# Patient Record
Sex: Male | Born: 1975 | Smoking: Current every day smoker
Health system: Southern US, Community
[De-identification: ages and names within clinical notes are randomized; demographics above are authoritative.]

## PROBLEM LIST (undated history)

## (undated) DIAGNOSIS — F1020 Alcohol dependence, uncomplicated: Secondary | ICD-10-CM

## (undated) DIAGNOSIS — IMO0001 Reserved for inherently not codable concepts without codable children: Secondary | ICD-10-CM

## (undated) DIAGNOSIS — F419 Anxiety disorder, unspecified: Secondary | ICD-10-CM

## (undated) DIAGNOSIS — G8929 Other chronic pain: Secondary | ICD-10-CM

## (undated) DIAGNOSIS — T1491XA Suicide attempt, initial encounter: Secondary | ICD-10-CM

## (undated) DIAGNOSIS — F1110 Opioid abuse, uncomplicated: Secondary | ICD-10-CM

## (undated) DIAGNOSIS — F319 Bipolar disorder, unspecified: Secondary | ICD-10-CM

## (undated) DIAGNOSIS — M549 Dorsalgia, unspecified: Secondary | ICD-10-CM

## (undated) HISTORY — PX: HERNIA REPAIR: SHX51

---

## 2013-10-01 ENCOUNTER — Emergency Department (HOSPITAL_COMMUNITY)
Admission: EM | Admit: 2013-10-01 | Discharge: 2013-10-01 | Discharge status: Against Medical Advice | Payer: Self-pay | Attending: Emergency Medicine | Admitting: Emergency Medicine

## 2013-10-01 DIAGNOSIS — Y9389 Activity, other specified: Secondary | ICD-10-CM | POA: Insufficient documentation

## 2013-10-01 DIAGNOSIS — W11XXXA Fall on and from ladder, initial encounter: Secondary | ICD-10-CM | POA: Insufficient documentation

## 2013-10-01 DIAGNOSIS — S4980XA Other specified injuries of shoulder and upper arm, unspecified arm, initial encounter: Secondary | ICD-10-CM | POA: Insufficient documentation

## 2013-10-01 DIAGNOSIS — S46909A Unspecified injury of unspecified muscle, fascia and tendon at shoulder and upper arm level, unspecified arm, initial encounter: Secondary | ICD-10-CM | POA: Insufficient documentation

## 2013-10-01 DIAGNOSIS — R110 Nausea: Secondary | ICD-10-CM | POA: Insufficient documentation

## 2013-10-01 DIAGNOSIS — Y929 Unspecified place or not applicable: Secondary | ICD-10-CM | POA: Insufficient documentation

## 2013-10-01 DIAGNOSIS — IMO0002 Reserved for concepts with insufficient information to code with codable children: Secondary | ICD-10-CM | POA: Insufficient documentation

## 2013-10-01 NOTE — ED Notes (Deleted)
Pt sts that she is having back pain, nausea and leg swelling. Pt has had this in the past and was seen at [facility] and dx with UTI. Pt was given an Rx that ends tomorrow. Pt sts the symptoms are not getting any better.

## 2013-10-16 ENCOUNTER — Encounter (HOSPITAL_COMMUNITY): Payer: Self-pay | Admitting: Emergency Medicine

## 2013-10-16 ENCOUNTER — Emergency Department (HOSPITAL_COMMUNITY)
Admission: EM | Admit: 2013-10-16 | Discharge: 2013-10-16 | Discharge status: Against Medical Advice | Payer: Self-pay | Attending: Emergency Medicine | Admitting: Emergency Medicine

## 2013-10-16 DIAGNOSIS — S139XXA Sprain of joints and ligaments of unspecified parts of neck, initial encounter: Secondary | ICD-10-CM | POA: Insufficient documentation

## 2013-10-16 DIAGNOSIS — Y990 Civilian activity done for income or pay: Secondary | ICD-10-CM | POA: Insufficient documentation

## 2013-10-16 DIAGNOSIS — S46909A Unspecified injury of unspecified muscle, fascia and tendon at shoulder and upper arm level, unspecified arm, initial encounter: Secondary | ICD-10-CM | POA: Insufficient documentation

## 2013-10-16 DIAGNOSIS — S4980XA Other specified injuries of shoulder and upper arm, unspecified arm, initial encounter: Secondary | ICD-10-CM | POA: Insufficient documentation

## 2013-10-16 DIAGNOSIS — F17200 Nicotine dependence, unspecified, uncomplicated: Secondary | ICD-10-CM | POA: Insufficient documentation

## 2013-10-16 DIAGNOSIS — S161XXA Strain of muscle, fascia and tendon at neck level, initial encounter: Secondary | ICD-10-CM

## 2013-10-16 DIAGNOSIS — R209 Unspecified disturbances of skin sensation: Secondary | ICD-10-CM | POA: Insufficient documentation

## 2013-10-16 DIAGNOSIS — Y9289 Other specified places as the place of occurrence of the external cause: Secondary | ICD-10-CM | POA: Insufficient documentation

## 2013-10-16 DIAGNOSIS — W108XXA Fall (on) (from) other stairs and steps, initial encounter: Secondary | ICD-10-CM | POA: Insufficient documentation

## 2013-10-16 DIAGNOSIS — Y9389 Activity, other specified: Secondary | ICD-10-CM | POA: Insufficient documentation

## 2013-10-16 MED ORDER — KETOROLAC TROMETHAMINE 30 MG/ML IJ SOLN: 30.0000 mg | Freq: Once | INTRAMUSCULAR | Status: DC

## 2013-10-16 MED ORDER — CYCLOBENZAPRINE HCL 10 MG PO TABS: 5.0000 mg | ORAL_TABLET | Freq: Once | ORAL | Status: DC

## 2013-10-16 NOTE — ED Notes (Signed)
The pt states he needs to go look for his friend in lobby, I told the pt I will go find his friend but he insisted that he must leave his room to go find his friend. Pt aware that he has an xray ordered but states he must find his friend or he will not have a ride home

## 2013-10-16 NOTE — ED Provider Notes (Signed)
CSN: [id number]     Arrival date & time 10/16/13  1522 History  This chart was scribed for non-physician practitioner, Earley FavorGail Gregory Dowe, FNP working with Shon Batonourtney F Horton, MD by Greggory StallionKayla Andersen, ED scribe. This patient was seen in room TR06C/TR06C and the patient's care was started at 4:07 PM.   Chief Complaint  Patient presents with  . Fall   The history is provided by the patient. No language interpreter was used.   HPI Comments: Alexander [name] is a 38 y.o. male who presents to the Emergency Department complaining of a fall that occurred yesterday at work. Pt fell down 7 steps and caught his arm in the brace of the steps and landed on his neck. He has sudden onset onset right shoulder pain and right sided neck pain. Pt has taken ibuprofen and tylenol with no relief. Turning his neck to the right worsens the pain. Pt is also starting to have mild numbness in his right arm, that started hours after the fall   History reviewed. No pertinent past medical history. Past Surgical History  Procedure Laterality Date  . Hernia repair     History reviewed. No pertinent family history. History  Substance Use Topics  . Smoking status: Current Every Day Smoker    Types: Cigarettes  . Smokeless tobacco: Not on file  . Alcohol Use: No    Review of Systems  Musculoskeletal: Positive for arthralgias and neck pain.  Neurological: Positive for numbness. Negative for dizziness, weakness and headaches.  All other systems reviewed and are negative.  Allergies  Review of patient's allergies indicates no known allergies.  Home Medications   Prior to Admission medications   Not on File   BP 124/78  Pulse 90  Temp(Src) 98.6 F (37 C) (Oral)  Resp 18  Ht 5\' 10"  (1.778 m)  SpO2 97%  Physical Exam  Nursing note and vitals reviewed. Constitutional: He is oriented to person, place, and time. He appears well-developed and well-nourished. No distress.  HENT:  Head: Normocephalic and atraumatic.   Eyes: EOM are normal.  Neck: Neck supple. No tracheal deviation present.  ROM limited to the right due to pain.   Cardiovascular: Normal rate.   Pulmonary/Chest: Effort normal. No respiratory distress.  Musculoskeletal: Normal range of motion.  Right superior trapezius tenderness. Good strength. Good pulses. Normal ROM of right arm and shoulder.   Neurological: He is alert and oriented to person, place, and time.  Skin: Skin is warm and dry.  Psychiatric: He has a normal mood and affect. His behavior is normal.    ED Course  Procedures (including critical care time)  DIAGNOSTIC STUDIES: Oxygen Saturation is 97% on RA, normal by my interpretation.    COORDINATION OF CARE: 4:09 PM-Discussed treatment plan which includes xray with pt at bedside and pt agreed to plan.   Labs Review Labs Reviewed - No data to display  Imaging Review No results found.   EKG Interpretation None      MDM  Pain, a CT scan of neck fracture or subluxation.  Unlikely to patient's symptoms of numbness.  Will review he has been given IM Toradol, as well as Flexeril.  We'll reassess after x-rays After being told that he was to be given a muscle relaxer and CT scan stated he had to go find his ride in the parking lot and never returned  Final diagnoses:  Cervical strain      I personally performed the services described in this documentation, which was  scribed in my presence. The recorded information has been reviewed and is accurate.  Arman FilterGail K Nadine Ryle, NP 10/16/13 1711  Arman FilterGail K Alexande Sheerin, NP 10/16/13 1712

## 2013-10-16 NOTE — ED Notes (Signed)
Pt reports falling at work yesterday and having neck pain, right shoulder pain and now developing numbness to right arm. No acute distress noted at triage, ambulatory.

## 2013-10-16 NOTE — ED Notes (Signed)
The pt has not returned

## 2013-10-16 NOTE — ED Notes (Signed)
The pt still has not returned to his room

## 2013-10-17 NOTE — ED Provider Notes (Signed)
Medical screening examination/treatment/procedure(s) were performed by non-physician practitioner and as supervising physician I was immediately available for consultation/collaboration.   EKG Interpretation None       Somya Jauregui F Morelia Cassells, MD 10/17/13 0100 

## 2013-12-29 ENCOUNTER — Emergency Department (HOSPITAL_COMMUNITY)
Admission: EM | Admit: 2013-12-29 | Discharge: 2013-12-30 | Disposition: A | Discharge status: Other Acute Inpatient Hospital | Payer: No Typology Code available for payment source | Attending: Emergency Medicine | Admitting: Emergency Medicine

## 2013-12-29 ENCOUNTER — Encounter (HOSPITAL_COMMUNITY): Payer: Self-pay | Admitting: Emergency Medicine

## 2013-12-29 DIAGNOSIS — R45851 Suicidal ideations: Secondary | ICD-10-CM

## 2013-12-29 DIAGNOSIS — F1110 Opioid abuse, uncomplicated: Secondary | ICD-10-CM | POA: Insufficient documentation

## 2013-12-29 DIAGNOSIS — G8929 Other chronic pain: Secondary | ICD-10-CM | POA: Insufficient documentation

## 2013-12-29 DIAGNOSIS — F319 Bipolar disorder, unspecified: Secondary | ICD-10-CM | POA: Insufficient documentation

## 2013-12-29 DIAGNOSIS — F332 Major depressive disorder, recurrent severe without psychotic features: Secondary | ICD-10-CM | POA: Diagnosis present

## 2013-12-29 DIAGNOSIS — T1491XA Suicide attempt, initial encounter: Secondary | ICD-10-CM | POA: Diagnosis present

## 2013-12-29 DIAGNOSIS — F17200 Nicotine dependence, unspecified, uncomplicated: Secondary | ICD-10-CM | POA: Insufficient documentation

## 2013-12-29 DIAGNOSIS — F411 Generalized anxiety disorder: Secondary | ICD-10-CM | POA: Insufficient documentation

## 2013-12-29 HISTORY — DX: Dorsalgia, unspecified: M54.9

## 2013-12-29 HISTORY — DX: Bipolar disorder, unspecified: F31.9

## 2013-12-29 HISTORY — DX: Other chronic pain: G89.29

## 2013-12-29 LAB — CBC
HEMATOCRIT: 39.9 % (ref 39.0–52.0)
HEMOGLOBIN: 14.3 g/dL (ref 13.0–17.0)
MCH: 29.8 pg (ref 26.0–34.0)
MCHC: 35.8 g/dL (ref 30.0–36.0)
MCV: 83.1 fL (ref 78.0–100.0)
Platelets: 257 10*3/uL (ref 150–400)
RBC: 4.8 MIL/uL (ref 4.22–5.81)
RDW: 12.5 % (ref 11.5–15.5)
WBC: 6.6 10*3/uL (ref 4.0–10.5)

## 2013-12-29 LAB — COMPREHENSIVE METABOLIC PANEL
ALK PHOS: 161 U/L — AB (ref 39–117)
ALT: 20 U/L (ref 0–53)
ANION GAP: 13 (ref 5–15)
AST: 13 U/L (ref 0–37)
Albumin: 3.5 g/dL (ref 3.5–5.2)
BILIRUBIN TOTAL: 0.4 mg/dL (ref 0.3–1.2)
BUN: 8 mg/dL (ref 6–23)
CHLORIDE: 101 meq/L (ref 96–112)
CO2: 26 meq/L (ref 19–32)
CREATININE: 0.69 mg/dL (ref 0.50–1.35)
Calcium: 9.7 mg/dL (ref 8.4–10.5)
GFR calc Af Amer: >90 mL/min (ref >90)
Glucose, Bld: 98 mg/dL (ref 70–99)
Potassium: 3.7 mEq/L (ref 3.7–5.3)
Sodium: 140 mEq/L (ref 137–147)
Total Protein: 7.1 g/dL (ref 6.0–8.3)

## 2013-12-29 LAB — RAPID URINE DRUG SCREEN, HOSP PERFORMED
Amphetamines: NOT DETECTED
Barbiturates: NOT DETECTED
Benzodiazepines: NOT DETECTED
Cocaine: NOT DETECTED
Opiates: POSITIVE — AB
TETRAHYDROCANNABINOL: NOT DETECTED

## 2013-12-29 LAB — SALICYLATE LEVEL: Salicylate Lvl: <2 mg/dL — ABNORMAL LOW (ref 2.8–20.0)

## 2013-12-29 LAB — ETHANOL

## 2013-12-29 LAB — ACETAMINOPHEN LEVEL: Acetaminophen (Tylenol), Serum: <15 ug/mL (ref 10–30)

## 2013-12-29 NOTE — ED Notes (Signed)
Patient endorses SI, states he took 15 sleeping pills last night.

## 2013-12-29 NOTE — ED Notes (Addendum)
Black shoes, black shorts, red shirt, orange backpack ( socks, shaving razors, sunglasses, black cell phone), underwear. Placed in locker 40

## 2013-12-29 NOTE — Progress Notes (Signed)
  CARE MANAGEMENT ED NOTE 12/29/2013  Patient:  Alexander [name],Alexander [name]   Account Number:  [id number]  Date Initiated:  12/29/2013  Documentation initiated by:  Radford PaxFERRERO,Jillianna Stanek  Subjective/Objective Assessment:   Patient presents to ED with SI stating she took 15 sleeping pills last night     Subjective/Objective Assessment Detail:     Action/Plan:   Action/Plan Detail:   Anticipated DC Date:       Status Recommendation to Physician:   Result of Recommendation:    Other ED Services  Consult Working Plan    DC Planning Services  Other  PCP issues    Choice offered to / List presented to:            Status of service:  Completed, signed off  ED Comments:   ED Comments Detail:  EDCM spoke to patient at bedside regarding pcp issues. Patient confirms without insurance and [place].  Patient reports he may have  insurance starting with his job soon.  [facility] provided patient with list of pcps who accept patients without insurance, list of discounted pharmacies and websites needymeds.org and Good https://figueroa.info/X.com for medication assistance, phone number to inquire about the orange card and DSS for Medicaid, financial resources in the community such as local churches and [employer],  and dental assistance for patients without insurance.  EDCM also provided patient with a pamphlet to [facility] and informed patient that walkins are welcome from 9am -1030am Mon- Thurs.  Patient thnakful for resources.  No further EDCM needs at this time.

## 2013-12-29 NOTE — ED Notes (Signed)
MD at bedside. 

## 2013-12-29 NOTE — ED Provider Notes (Signed)
CSN: [id number]     Arrival date & time 12/29/13  1944 History   First MD Initiated Contact with Patient 12/29/13 2032     Chief Complaint  Patient presents with  . Suicidal    took 15 sleeping pills yesterday     (Consider location/radiation/quality/duration/timing/severity/associated sxs/prior Treatment) HPI 38 year old male with history of bipolar disorder anxiety depression chronic back pain presents with several weeks of depression worsening with suicidal ideation he states he took 2 over-the-counter sleeping pills last night as well as was injected with an unknown drug last night off the streets to try to kill himself; he denies any threats to harm others denies hallucinations denies homicidal ideation but still has suicidal ideation and would like help to get better. He denies other concerns at this time. There is no treatment prior to arrival. Past Medical History  Diagnosis Date  . Chronic back pain   . Bipolar disorder    Past Surgical History  Procedure Laterality Date  . Hernia repair     History reviewed. No pertinent family history. History  Substance Use Topics  . Smoking status: Current Every Day Smoker -- 1.00 packs/day    Types: Cigarettes  . Smokeless tobacco: Never Used  . Alcohol Use: No     Comment: occ    Review of Systems  10 Systems reviewed and are negative for acute change except as noted in the HPI.   Allergies  Review of patient's allergies indicates no known allergies.  Home Medications   Prior to Admission medications   Not on File   BP 120/82  Pulse 74  Temp(Src) 98.2 F (36.8 C) (Oral)  Resp 12  SpO2 99% Physical Exam  Nursing note and vitals reviewed. Constitutional: He is oriented to person, place, and time.  Awake, alert, nontoxic appearance.  HENT:  Head: Atraumatic.  Eyes: Right eye exhibits no discharge. Left eye exhibits no discharge.  Neck: Neck supple.  Cardiovascular: Normal rate and regular rhythm.   No murmur  heard. Pulmonary/Chest: Effort normal and breath sounds normal. No respiratory distress. He has no wheezes. He has no rales. He exhibits no tenderness.  Abdominal: Soft. Bowel sounds are normal. There is no tenderness. There is no rebound.  Musculoskeletal: He exhibits no tenderness.  Baseline ROM, no obvious new focal weakness.  Neurological: He is alert and oriented to person, place, and time.  Mental status and motor strength appears baseline for patient and situation.  Skin: No rash noted.  Psychiatric:  Patient appears anxious and depressed with suicidal ideation without homicidal ideation or hallucinations    ED Course  Procedures (including critical care time) Patient understand and agree with initial ED impression and plan with expectations set for ED visit.TTS pnd. Labs Review Labs Reviewed  COMPREHENSIVE METABOLIC PANEL - Abnormal; Notable for the following:    Alkaline Phosphatase 161 (*)    All other components within normal limits  SALICYLATE LEVEL - Abnormal; Notable for the following:    Salicylate Lvl <2.0 (*)    All other components within normal limits  URINE RAPID DRUG SCREEN (HOSP PERFORMED) - Abnormal; Notable for the following:    Opiates POSITIVE (*)    All other components within normal limits  ACETAMINOPHEN LEVEL  CBC  ETHANOL    Imaging Review No results found.   EKG Interpretation None     Muse not working: ECG: Normal sinus rhythm, ventricular rate 72, normal axis, impression normal ECG, no comparison ECG available MDM   Final diagnoses:  Suicidal ideation    Dispo pnd.    Hurman HornJohn M Cas Tracz, MD 12/30/13 1341

## 2013-12-30 ENCOUNTER — Inpatient Hospital Stay (HOSPITAL_COMMUNITY)
Admission: AD | Admit: 2013-12-30 | Discharge: 2014-01-02 | DRG: 885 | Disposition: A | Discharge status: Home / Self Care | Payer: No Typology Code available for payment source | Source: Intra-hospital | Attending: Psychiatry | Admitting: Psychiatry

## 2013-12-30 ENCOUNTER — Encounter (HOSPITAL_COMMUNITY): Payer: Self-pay | Admitting: *Deleted

## 2013-12-30 ENCOUNTER — Encounter (HOSPITAL_COMMUNITY): Payer: Self-pay | Admitting: Registered Nurse

## 2013-12-30 DIAGNOSIS — F4310 Post-traumatic stress disorder, unspecified: Secondary | ICD-10-CM | POA: Diagnosis present

## 2013-12-30 DIAGNOSIS — F1110 Opioid abuse, uncomplicated: Secondary | ICD-10-CM | POA: Diagnosis present

## 2013-12-30 DIAGNOSIS — F332 Major depressive disorder, recurrent severe without psychotic features: Principal | ICD-10-CM | POA: Diagnosis present

## 2013-12-30 DIAGNOSIS — M549 Dorsalgia, unspecified: Secondary | ICD-10-CM | POA: Diagnosis present

## 2013-12-30 DIAGNOSIS — F411 Generalized anxiety disorder: Secondary | ICD-10-CM | POA: Diagnosis present

## 2013-12-30 DIAGNOSIS — R45851 Suicidal ideations: Secondary | ICD-10-CM

## 2013-12-30 DIAGNOSIS — T1491XA Suicide attempt, initial encounter: Secondary | ICD-10-CM | POA: Diagnosis present

## 2013-12-30 DIAGNOSIS — F17200 Nicotine dependence, unspecified, uncomplicated: Secondary | ICD-10-CM | POA: Diagnosis present

## 2013-12-30 DIAGNOSIS — G8929 Other chronic pain: Secondary | ICD-10-CM | POA: Diagnosis present

## 2013-12-30 DIAGNOSIS — F1910 Other psychoactive substance abuse, uncomplicated: Secondary | ICD-10-CM

## 2013-12-30 DIAGNOSIS — F1994 Other psychoactive substance use, unspecified with psychoactive substance-induced mood disorder: Secondary | ICD-10-CM

## 2013-12-30 DIAGNOSIS — G4700 Insomnia, unspecified: Secondary | ICD-10-CM | POA: Diagnosis present

## 2013-12-30 HISTORY — DX: Anxiety disorder, unspecified: F41.9

## 2013-12-30 MED ORDER — DICYCLOMINE HCL 20 MG PO TABS
20.0000 mg | ORAL_TABLET | Freq: Four times a day (QID) | ORAL | Status: DC | PRN
  Administered 2013-12-31: 20 mg via ORAL
  Filled 2013-12-30: qty 1

## 2013-12-30 MED ORDER — ALUM & MAG HYDROXIDE-SIMETH 200-200-20 MG/5ML PO SUSP: 30.0000 mL | ORAL | Status: DC | PRN

## 2013-12-30 MED ORDER — LORAZEPAM 1 MG PO TABS
1.0000 mg | ORAL_TABLET | Freq: Once | ORAL | Status: AC
  Administered 2013-12-30: 1 mg via ORAL
  Filled 2013-12-30: qty 1

## 2013-12-30 MED ORDER — ONDANSETRON 4 MG PO TBDP
4.0000 mg | ORAL_TABLET | Freq: Four times a day (QID) | ORAL | Status: DC | PRN
  Administered 2013-12-31: 4 mg via ORAL
  Filled 2013-12-30: qty 1

## 2013-12-30 MED ORDER — CLONIDINE HCL 0.1 MG PO TABS: 0.1000 mg | ORAL_TABLET | ORAL | Status: DC

## 2013-12-30 MED ORDER — LOPERAMIDE HCL 2 MG PO CAPS
2.0000 mg | ORAL_CAPSULE | ORAL | Status: DC | PRN
  Filled 2013-12-30: qty 1

## 2013-12-30 MED ORDER — NAPROXEN 500 MG PO TABS
500.0000 mg | ORAL_TABLET | Freq: Two times a day (BID) | ORAL | Status: DC | PRN
  Administered 2013-12-31 – 2014-01-02 (×6): 500 mg via ORAL
  Filled 2013-12-30 (×6): qty 1

## 2013-12-30 MED ORDER — MAGNESIUM HYDROXIDE 400 MG/5ML PO SUSP: 30.0000 mL | Freq: Every day | ORAL | Status: DC | PRN

## 2013-12-30 MED ORDER — METHOCARBAMOL 500 MG PO TABS
500.0000 mg | ORAL_TABLET | Freq: Three times a day (TID) | ORAL | Status: DC | PRN
  Administered 2013-12-31 (×2): 500 mg via ORAL
  Filled 2013-12-30 (×2): qty 1

## 2013-12-30 MED ORDER — CLONIDINE HCL 0.1 MG PO TABS
0.1000 mg | ORAL_TABLET | Freq: Four times a day (QID) | ORAL | Status: DC
  Administered 2013-12-30 – 2013-12-31 (×2): 0.1 mg via ORAL
  Filled 2013-12-30 (×7): qty 1

## 2013-12-30 MED ORDER — ACETAMINOPHEN 325 MG PO TABS: 650.0000 mg | ORAL_TABLET | Freq: Four times a day (QID) | ORAL | Status: DC | PRN

## 2013-12-30 MED ORDER — TRAZODONE HCL 50 MG PO TABS
50.0000 mg | ORAL_TABLET | Freq: Every evening | ORAL | Status: DC | PRN
  Administered 2013-12-30 (×2): 50 mg via ORAL
  Filled 2013-12-30 (×6): qty 1

## 2013-12-30 MED ORDER — HYDROXYZINE HCL 25 MG PO TABS
25.0000 mg | ORAL_TABLET | Freq: Four times a day (QID) | ORAL | Status: DC | PRN
Start: 2013-12-30 — End: 2014-01-02
  Administered 2013-12-31: 25 mg via ORAL
  Filled 2013-12-30: qty 1

## 2013-12-30 MED ORDER — CLONIDINE HCL 0.1 MG PO TABS: 0.1000 mg | ORAL_TABLET | Freq: Every day | ORAL | Status: DC

## 2013-12-30 NOTE — Progress Notes (Signed)
Adult Psychoeducational Group Note  Date:  12/30/2013 Time:  9:33 PM  Group Topic/Focus:  Wrap-Up Group:   The focus of this group is to help patients review their daily goal of treatment and discuss progress on daily workbooks.  Participation Level:  Active  Participation Quality:  Appropriate and Attentive  Affect:  Appropriate  Cognitive:  Appropriate  Insight: Appropriate  Engagement in Group:  Engaged  Modes of Intervention:  Discussion  Additional Comments:  Pt attended the wrap up group this evening and remained appropriate and engaged throughout the duration of the group. Pt ranked his day as a 4 because he had to wait an extended period in another hospital before [facility]. Pt also shared that he doesn't feel very good today.  Sheran Lawlesseese, Kolette Vey O 12/30/2013, 9:33 PM

## 2013-12-30 NOTE — Progress Notes (Signed)
[facility] provided pt with a list of primary care resources and a [facility] Orange Card application, highlighting [facility], to help patient establish primary care.  °

## 2013-12-30 NOTE — BH Assessment (Signed)
[facility] Assessment Progress Note   [facility] to the service of Dr Dub MikesLugo per Phillip HealAva Stevenson and will go to room 301-2.  Support paperwork complete.  ED staff notified.

## 2013-12-30 NOTE — Progress Notes (Signed)
Pt was given "New Beginnings" packet. Pt asked about the packet but did not appear interested in reading it.

## 2013-12-30 NOTE — Consult Note (Signed)
[place] Psychiatry Consult   Reason for Consult:  Suicidal Ideation; overdose Referring Physician:  EDP  Kie Calvin is an 38 y.o. male. Total Time spent with patient: 45 minutes  Assessment: AXIS I:  Major Depression, Recurrent severe, Substance Abuse and Substance Induced Mood Disorder AXIS II:  Deferred AXIS III:   Past Medical History  Diagnosis Date  . Chronic back pain   . Bipolar disorder    AXIS IV:  other psychosocial or environmental problems AXIS V:  11-20 some danger of hurting self or others possible OR occasionally fails to maintain minimal personal hygiene OR gross impairment in communication  Plan:  Recommend psychiatric Inpatient admission when medically cleared.  Subjective:   Shun Pletz is a 38 y.o. male patient admitted with Major Depression, Recurrent severe, Substance Abuse and Substance Induced Mood Disorder.  HPI:  Patient states "I tried suicide attempt; overdosing on sleeping pills; I wanted to die."  Patient states that he goes to [facility] for outpatient services; last visit was a month ago.  Patient states that he stopped taking his medication "because I couldn't afford it."  Patient also has a past history of inpatient hospitalization related to previous suicide attempts cutting wrist and jumping off of a bridge. Patient continues to endorse suicidal ideation with a plan.  Patient denies homicidal ideation, psychosis, and paranoia.  HPI Elements:   Location:  Depression. Quality:  Suicide attempt. Severity:  Overdosing sleeping pills. Timing:  1 day. Review of Systems  Constitutional: Negative for diaphoresis.  Gastrointestinal: Negative for abdominal pain.  Musculoskeletal: Negative.   Neurological: Negative for headaches.  Psychiatric/Behavioral: Positive for depression, suicidal ideas and substance abuse. Negative for hallucinations and memory loss. The patient is not nervous/anxious and does not have insomnia.      History reviewed. No pertinent family history.  Past Psychiatric History: Past Medical History  Diagnosis Date  . Chronic back pain   . Bipolar disorder     reports that he has been smoking Cigarettes.  He has been smoking about 1.00 pack per day. He has never used smokeless tobacco. He reports that he does not drink alcohol or use illicit drugs. History reviewed. No pertinent family history.         Allergies:  No Known Allergies  ACT Assessment Complete:  Yes:    Educational Status    Risk to Self: Risk to self Is patient at risk for suicide?: Yes Substance abuse history and/or treatment for substance abuse?: Yes  Risk to Others:    Abuse:    Prior Inpatient Therapy:    Prior Outpatient Therapy:    Additional Information:      Objective: Blood pressure 120/82, pulse 74, temperature 98.2 F (36.8 C), temperature source Oral, resp. rate 12, SpO2 99.00%.There is no weight on file to calculate BMI. Results for orders placed during the hospital encounter of 12/29/13 (from the past 72 hour(s))  ACETAMINOPHEN LEVEL     Status: None   Collection Time    12/29/13  8:18 PM      Result Value Ref Range   Acetaminophen (Tylenol), Serum <15.0  10 - 30 ug/mL   Comment:            THERAPEUTIC CONCENTRATIONS VARY     SIGNIFICANTLY. A RANGE OF 10-30     ug/mL MAY BE AN EFFECTIVE     CONCENTRATION FOR MANY PATIENTS.     HOWEVER, SOME ARE BEST TREATED     AT CONCENTRATIONS OUTSIDE THIS  RANGE.     ACETAMINOPHEN CONCENTRATIONS     >150 ug/mL AT 4 HOURS AFTER     INGESTION AND >50 ug/mL AT 12     HOURS AFTER INGESTION ARE     OFTEN ASSOCIATED WITH TOXIC     REACTIONS.  CBC     Status: None   Collection Time    12/29/13  8:18 PM      Result Value Ref Range   WBC 6.6  4.0 - 10.5 K/uL   RBC 4.80  4.22 - 5.81 MIL/uL   Hemoglobin 14.3  13.0 - 17.0 g/dL   HCT 39.9  39.0 - 52.0 %   MCV 83.1  78.0 - 100.0 fL   MCH 29.8  26.0 - 34.0 pg   MCHC 35.8  30.0 - 36.0 g/dL   RDW  12.5  11.5 - 15.5 %   Platelets 257  150 - 400 K/uL  COMPREHENSIVE METABOLIC PANEL     Status: Abnormal   Collection Time    12/29/13  8:18 PM      Result Value Ref Range   Sodium 140  137 - 147 mEq/L   Potassium 3.7  3.7 - 5.3 mEq/L   Chloride 101  96 - 112 mEq/L   CO2 26  19 - 32 mEq/L   Glucose, Bld 98  70 - 99 mg/dL   BUN 8  6 - 23 mg/dL   Creatinine, Ser 0.69  0.50 - 1.35 mg/dL   Calcium 9.7  8.4 - 10.5 mg/dL   Total Protein 7.1  6.0 - 8.3 g/dL   Albumin 3.5  3.5 - 5.2 g/dL   AST 13  0 - 37 U/L   ALT 20  0 - 53 U/L   Alkaline Phosphatase 161 (*) 39 - 117 U/L   Total Bilirubin 0.4  0.3 - 1.2 mg/dL   GFR calc non Af Amer >90  >90 mL/min   GFR calc Af Amer >90  >90 mL/min   Comment: (NOTE)     The eGFR has been calculated using the CKD EPI equation.     This calculation has not been validated in all clinical situations.     eGFR's persistently <90 mL/min signify possible Chronic Kidney     Disease.   Anion gap 13  5 - 15  ETHANOL     Status: None   Collection Time    12/29/13  8:18 PM      Result Value Ref Range   Alcohol, Ethyl (B) <11  0 - 11 mg/dL   Comment:            LOWEST DETECTABLE LIMIT FOR     SERUM ALCOHOL IS 11 mg/dL     FOR MEDICAL PURPOSES ONLY  SALICYLATE LEVEL     Status: Abnormal   Collection Time    12/29/13  8:18 PM      Result Value Ref Range   Salicylate Lvl <4.9 (*) 2.8 - 20.0 mg/dL  URINE RAPID DRUG SCREEN (HOSP PERFORMED)     Status: Abnormal   Collection Time    12/29/13  8:29 PM      Result Value Ref Range   Opiates POSITIVE (*) NONE DETECTED   Cocaine NONE DETECTED  NONE DETECTED   Benzodiazepines NONE DETECTED  NONE DETECTED   Amphetamines NONE DETECTED  NONE DETECTED   Tetrahydrocannabinol NONE DETECTED  NONE DETECTED   Barbiturates NONE DETECTED  NONE DETECTED   Comment:  DRUG SCREEN FOR MEDICAL PURPOSES     ONLY.  IF CONFIRMATION IS NEEDED     FOR ANY PURPOSE, NOTIFY LAB     WITHIN 5 DAYS.                LOWEST  DETECTABLE LIMITS     FOR URINE DRUG SCREEN     Drug Class       Cutoff (ng/mL)     Amphetamine      1000     Barbiturate      200     Benzodiazepine   275     Tricyclics       170     Opiates          300     Cocaine          300     THC              50   Labs are reviewed see above values.  Medications reviewed and no changes made.  No current facility-administered medications for this encounter.   No current outpatient prescriptions on file.    Psychiatric Specialty Exam:     Blood pressure 120/82, pulse 74, temperature 98.2 F (36.8 C), temperature source Oral, resp. rate 12, SpO2 99.00%.There is no weight on file to calculate BMI.  General Appearance: Casual  Eye Contact::  Good  Speech:  Clear and Coherent and Normal Rate  Volume:  Normal  Mood:  Depressed and Hopeless  Affect:  Blunt, Congruent, Depressed and Flat  Thought Process:  Circumstantial  Orientation:  Full (Time, Place, and Person)  Thought Content:  Rumination  Suicidal Thoughts:  Yes.  with intent/plan  Homicidal Thoughts:  No  Memory:  Immediate;   Good Recent;   Good Remote;   Good  Judgement:  Poor  Insight:  Lacking and Shallow  Psychomotor Activity:  Decreased  Concentration:  Fair  Recall:  Good  Fund of Knowledge:Good  Language: Good  Akathisia:  No  Handed:  Right  AIMS (if indicated):     Assets:  Communication Skills Desire for Improvement  Sleep:      Musculoskeletal: Strength & Muscle Tone: within normal limits Gait & Station: normal Patient leans: N/A  Treatment Plan Summary: Daily contact with patient to assess and evaluate symptoms and progress in treatment Medication management  Recommend inpatient treatment.  Patient has been accepted to [place] 301/02.  Will monitor for safety and stabilization until transferred.   Earleen Newport, FNP-BC 12/30/2013 3:04 PM

## 2013-12-30 NOTE — Tx Team (Signed)
Initial Interdisciplinary Treatment Plan  PATIENT STRENGTHS: (choose at least two) Average or above average intelligence Capable of independent living Physical Health Work skills  PATIENT STRESSORS: Financial difficulties Marital or family conflict Medication change or noncompliance   PROBLEM LIST: Problem List/Patient Goals Date to be addressed Date deferred Reason deferred Estimated date of resolution  Alt.thought-depression      SI-attempt                                                 DISCHARGE CRITERIA:  Improved stabilization in mood, thinking, and/or behavior Motivation to continue treatment in a less acute level of care  PRELIMINARY DISCHARGE PLAN: Outpatient therapy  PATIENT/FAMIILY INVOLVEMENT: This treatment plan has been presented to and reviewed with the patient, Alexander [name], and/or family member.  The patient and family have been given the opportunity to ask questions and make suggestions.  Alexander [name], Alexander [name] 12/30/2013, 7:22 PM

## 2013-12-30 NOTE — Progress Notes (Signed)
[facility] Bed [facility]-2.

## 2013-12-30 NOTE — Progress Notes (Signed)
Patient is a 38 [facility] after a suicide attempt on 15 sleeping pills, which he doesn't remember the name of. Patient stated that [place] 6 [place] ago in order to take a jon at [employer]. Patient stated that he recently has lost his car and is stressed about transportation to and from work. Patient stated that his family is still in [place], including his 6216 and 40 year old son. Patient stated that he does not have a good relationship with his children. Patient stated he was not having suicidal thoughts at this time, but contracted for safety while here. Patient stated that he knew he "couldn't hurt himself here, but could when he was discharged." Patient is flat, anxious and guarded. Patient cooperative with the admission process. Patient given unit policies and procedures and verbally expressed understanding. Patient oriented to unit and offered food and fluids.

## 2013-12-31 ENCOUNTER — Encounter (HOSPITAL_COMMUNITY): Payer: Self-pay | Admitting: Psychiatry

## 2013-12-31 DIAGNOSIS — F3130 Bipolar disorder, current episode depressed, mild or moderate severity, unspecified: Secondary | ICD-10-CM

## 2013-12-31 DIAGNOSIS — F4310 Post-traumatic stress disorder, unspecified: Secondary | ICD-10-CM | POA: Diagnosis present

## 2013-12-31 DIAGNOSIS — R45851 Suicidal ideations: Secondary | ICD-10-CM

## 2013-12-31 MED ORDER — NICOTINE 21 MG/24HR TD PT24
21.0000 mg | MEDICATED_PATCH | Freq: Every day | TRANSDERMAL | Status: DC
  Administered 2013-12-31 – 2014-01-02 (×3): 21 mg via TRANSDERMAL
  Filled 2013-12-31 (×4): qty 1

## 2013-12-31 MED ORDER — LOPERAMIDE HCL 2 MG PO CAPS
4.0000 mg | ORAL_CAPSULE | ORAL | Status: DC | PRN
  Administered 2013-12-31: 4 mg via ORAL

## 2013-12-31 MED ORDER — ONDANSETRON HCL 4 MG PO TABS: 4.0000 mg | ORAL_TABLET | Freq: Three times a day (TID) | ORAL | Status: DC | PRN

## 2013-12-31 MED ORDER — MIRTAZAPINE 30 MG PO TABS
30.0000 mg | ORAL_TABLET | Freq: Every day | ORAL | Status: DC
  Administered 2013-12-31: 30 mg via ORAL
  Filled 2013-12-31 (×3): qty 1

## 2013-12-31 MED ORDER — ARIPIPRAZOLE 5 MG PO TABS
5.0000 mg | ORAL_TABLET | Freq: Every day | ORAL | Status: DC
  Administered 2013-12-31 – 2014-01-02 (×3): 5 mg via ORAL
  Filled 2013-12-31 (×4): qty 1
  Filled 2013-12-31: qty 14
  Filled 2013-12-31 (×2): qty 1

## 2013-12-31 MED ORDER — FLUOXETINE HCL 20 MG PO CAPS
20.0000 mg | ORAL_CAPSULE | Freq: Every day | ORAL | Status: DC
  Administered 2013-12-31 – 2014-01-02 (×3): 20 mg via ORAL
  Filled 2013-12-31: qty 14
  Filled 2013-12-31 (×6): qty 1

## 2013-12-31 NOTE — Progress Notes (Signed)
Pt woke up around 420 am complaining of withdrawal irritability and sleeplessness    Pt received vistaril robaxin and naproxen   He also received gator aide   Discussed withdrawal symptoms with patient and medications that would be theraputic for each symptom   Pt verbalized understanding and went back to bed  Medications will be evaluated in epic under doc flow sheets at appropriate time

## 2013-12-31 NOTE — Progress Notes (Signed)
Patient ID: Alexander [name], male   DOB: 23-Jul-1975, 38 y.o.   MRN: [id number] D: pt. Visible on unit in dayroom, no interaction noted. Pt reports depression at "6" of 10, currently denies SI. "I was off my medications" A: Writer provided emotional support, reviewed medications encouraged client to report any concerns. Staff will monitor q6415min for safety. R: Pt. Is safe on the unit, attended group.

## 2013-12-31 NOTE — [facility] Suicide Risk Assessment (Signed)
[facility] INPATIENT:  Family/Significant Other Suicide Prevention Education  Suicide Prevention Education:  Patient Refusal for Family/Significant Other Suicide Prevention Education: The patient Alexander [name] has refused to provide written consent for family/significant other to be provided Family/Significant Other Suicide Prevention Education during admission and/or prior to discharge.  Physician notified.  SPE completed with pt. SPI pamphlet provided to pt and he was encouraged to share information with support network, ask questions, and talk about any concerns relating to SPE.  Smart, Danali Marinos LCSWA  12/31/2013, 12:40 PM

## 2013-12-31 NOTE — [facility] Suicide Risk Assessment (Signed)
Suicide Risk Assessment  Admission Assessment     Nursing information obtained from:  Patient Demographic factors:  Male;Caucasian;Living alone;Low socioeconomic status Current Mental Status:  Self-harm thoughts;Self-harm behaviors Loss Factors:  Financial problems / change in socioeconomic status Historical Factors:  Prior suicide attempts;Impulsivity Risk Reduction Factors:  Employed Total Time spent with patient: 45 minutes  CLINICAL FACTORS:   Depression:   Comorbid alcohol abuse/dependence Impulsivity Insomnia Severe  PsychCOGNITIVE FEATURES THAT CONTRIBUTE TO RISK:  Closed-mindedness Polarized thinking Thought constriction (tunnel vision)    SUICIDE RISK:   Moderate:  Frequent suicidal ideation with limited intensity, and duration, some specificity in terms of plans, no associated intent, good self-control, limited dysphoria/symptomatology, some risk factors present, and identifiable protective factors, including available and accessible social support.  PLAN OF CARE: Supportive approach/coping skills/relapse prevention                               Resume Prozac/Abilify  I certify that inpatient services furnished can reasonably be expected to improve the patient's condition.  Hayden Mabin A 12/31/2013, 4:09 PM

## 2013-12-31 NOTE — Consult Note (Signed)
Face to face evaluation and I agree with this note 
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
## 2013-12-31 NOTE — Progress Notes (Signed)
Patient ID: Alexander [name], male   DOB: 01/04/1976, 38 y.o.   MRN: [id number] He has been in bed most of day except to get his medications and meals. He is flat not talking except to answer a question. Self Inventory: depression  And hopeless at 10, withdrawals of agitation, SI of and off contracts for safety, and he denies pain.
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
## 2013-12-31 NOTE — [facility] Group Notes (Signed)
[facility] LCSW Group Therapy  12/31/2013 2:51 PM  Type of Therapy:  Group Therapy  Participation Level:  Did Not Attend-pt in room resting.   Smart, Clatie Kessen LCSWA  12/31/2013, 2:51 PM

## 2013-12-31 NOTE — Clinical Social Work Note (Signed)
Per pt request, [facility] [place] [employer] [employer] ATTN: Will Long excusing pt from work from 7/6 until his discharge. Pt signed ROI consenting to transmission of this information and he was provided with fax and confirmation sheet for his records.   The Sherwin-WilliamsHeather Smart, LCSWA 12/31/2013 11:16 AM

## 2013-12-31 NOTE — [facility] Group Notes (Signed)
[facility] Note   12/31/2013 11:01 AM  Participation Quality:  DID NOT ATTEND-pt in room resting.   Smart, American FinancialHeather LCSWA

## 2014-01-01 DIAGNOSIS — F411 Generalized anxiety disorder: Secondary | ICD-10-CM

## 2014-01-01 MED ORDER — LORAZEPAM 1 MG PO TABS
ORAL_TABLET | ORAL | Status: AC
  Filled 2014-01-01: qty 1

## 2014-01-01 MED ORDER — LORAZEPAM 1 MG PO TABS
1.0000 mg | ORAL_TABLET | Freq: Four times a day (QID) | ORAL | Status: DC | PRN
  Administered 2014-01-01 – 2014-01-02 (×3): 1 mg via ORAL
  Filled 2014-01-01 (×2): qty 1

## 2014-01-01 MED ORDER — MIRTAZAPINE 15 MG PO TABS
45.0000 mg | ORAL_TABLET | Freq: Every day | ORAL | Status: DC
  Administered 2014-01-01: 45 mg via ORAL
  Filled 2014-01-01: qty 42
  Filled 2014-01-01 (×2): qty 1

## 2014-01-01 NOTE — [facility] Group Notes (Signed)
0900 nursing orientation group   The focus of this group is to educate the patient on the purpose and policies of crisis stabilization and provide a format to answer questions about their admission.  The group details unit policies and expectations of patients while admitted.  Pt was an active participant he stated, "I want talk with doctor about my medications"

## 2014-01-01 NOTE — Progress Notes (Signed)
D: Patient denies SI, HI or AVH.  Pt. Complains of chronic back pain and anxiety this morning.  He is flat, anxious and depressed.  He forwards little but is up attending groups and is otherwise appropriate with others within the milieu.  A: Patient given emotional support from RN. Patient encouraged to come to staff with concerns and/or questions. Patient's medication routine continued. Patient's orders and plan of care reviewed.   R: Patient remains appropriate and cooperative. Will continue to monitor patient q15 minutes for safety.

## 2014-01-01 NOTE — Progress Notes (Signed)
Patient did attend the evening karaoke group. Pt was attentive and supportive but did not participate by singing a song.   

## 2014-01-01 NOTE — Progress Notes (Signed)
Recreation Therapy Notes  Animal-Assisted Activity/Therapy (AAA/T) Program Checklist/Progress Notes Patient Eligibility Criteria Checklist & Daily Group note for Rec Tx Intervention  Date: 07.09.2015 Time: 2:45pm Location: [address] Dayroom    AAA/T Program Assumption of Risk Form signed by Patient/ or Parent Legal Guardian yes  Patient is free of allergies or sever asthma yes  Patient reports no fear of animals yes  Patient reports no history of cruelty to animals yes   Patient understands his/her participation is voluntary yes  Patient washes hands before animal contact yes  Patient washes hands after animal contact yes  Behavioral Response: Engaged, Appropriate   Education: Hand Washing, Appropriate Animal Interaction   Education Outcome: Acknowledges understanding   Clinical Observations/Feedback: Patient interacted appropriately with therapy dog and peers in session.   Khoa Opdahl L Alanee Ting, LRT/CTRS  Miria Cappelli L 01/01/2014 4:05 PM 

## 2014-01-01 NOTE — Progress Notes (Signed)
[facility] MD Progress Note  01/01/2014 3:12 PM Alexander [name]  MRN:  [id number] Subjective:  Alexander [name] states he experienced a lot of anxiety, agitation. "Vistaril not helping." Has not heard from work, admits it is making him very anxious, worried. Alexander [name] has been off his medications for a while so understands that it is going to take some time before he sees benefit and gets used to side effects Diagnosis:   DSM5: Schizophrenia Disorders:  none Obsessive-Compulsive Disorders:  none Trauma-Stressor Disorders:  Posttraumatic Stress Disorder (309.81) Substance/Addictive Disorders:  Past HX Depressive Disorders:  Major Depressive Disorder - Severe (296.23) Total Time spent with patient: 30 minutes  Axis I: Generalized Anxiety Disorder R/O Bipolar Depression  ADL's:  Intact  Sleep: Poor  Appetite:  Poor  Suicidal Ideation:  Plan:  denies Intent:  denies Means:  denies Homicidal Ideation:  Plan:  denies Intent:  denies Means:  denies AEB (as evidenced by):  Psychiatric Specialty Exam: Physical Exam  Review of Systems  Constitutional: Positive for malaise/fatigue.  HENT: Negative.   Eyes: Negative.   Respiratory: Negative.   Cardiovascular: Negative.   Gastrointestinal: Negative.   Genitourinary: Negative.   Musculoskeletal: Negative.   Skin: Negative.   Neurological: Positive for weakness.  Endo/Heme/Allergies: Negative.   Psychiatric/Behavioral: Positive for depression. The patient is nervous/anxious and has insomnia.     Blood pressure 130/82, pulse 94, temperature 98 F (36.7 C), temperature source Oral, resp. rate 20, height 5' 8.11" (1.73 m), weight 86.183 kg (190 lb).Body mass index is 28.8 kg/(m^2).  General Appearance: Disheveled  Alexander [name]::  Fair  Speech:  Clear and Coherent  Volume:  Decreased  Mood:  Anxious, Depressed and worried  Affect:  anxious, worried  Thought Process:  Coherent and Goal Directed  Orientation:  Full (Time, Place, and Person)   Thought Content:  worries, concerns, symptoms  Suicidal Thoughts:  Intermittent  Homicidal Thoughts:  No  Memory:  Immediate;   Fair Recent;   Fair Remote;   Fair  Judgement:  Fair  Insight:  Shallow  Psychomotor Activity:  Restlessness  Concentration:  Fair  Recall:  Alexander [name] of Knowledge:NA  Language: Fair  Akathisia:  Yes  Handed:    AIMS (if indicated):     Assets:  Desire for Improvement Vocational/Educational  Sleep:  Number of Hours: 6   Musculoskeletal: Strength & Muscle Tone: within normal limits Gait & Station: normal Patient leans: N/A  Current Medications: Current Facility-Administered Medications  Medication Dose Route Frequency Provider Last Rate Last Dose  . acetaminophen (TYLENOL) tablet 650 mg  650 mg Oral Q6H PRN Shuvon Rankin, NP      . alum & mag hydroxide-simeth (MAALOX/MYLANTA) 200-200-20 MG/5ML suspension 30 mL  30 mL Oral Q4H PRN Shuvon Rankin, NP      . ARIPiprazole (ABILIFY) tablet 5 mg  5 mg Oral Daily Rachael Fee, MD   5 mg at 01/01/14 0750  . dicyclomine (BENTYL) tablet 20 mg  20 mg Oral Q6H PRN Kerry Hough, PA-C   20 mg at 12/31/13 1528  . FLUoxetine (PROZAC) capsule 20 mg  20 mg Oral Daily Rachael Fee, MD   20 mg at 01/01/14 0750  . hydrOXYzine (ATARAX/VISTARIL) tablet 25 mg  25 mg Oral Q6H PRN Kerry Hough, PA-C   25 mg at 12/31/13 0428  . loperamide (IMODIUM) capsule 2-4 mg  2-4 mg Oral PRN Kerry Hough, PA-C      . loperamide (IMODIUM) capsule 4 mg  4  mg Oral PRN Rachael FeeIrving A Marygrace Sandoval, MD   4 mg at 12/31/13 1528  . LORazepam (ATIVAN) tablet 1 mg  1 mg Oral Q6H PRN Rachael FeeIrving A Jamarrion Budai, MD   1 mg at 01/01/14 0941  . magnesium hydroxide (MILK OF MAGNESIA) suspension 30 mL  30 mL Oral Daily PRN Shuvon Rankin, NP      . methocarbamol (ROBAXIN) tablet 500 mg  500 mg Oral Q8H PRN Kerry HoughSpencer E Simon, PA-C   500 mg at 12/31/13 2151  . mirtazapine (REMERON) tablet 45 mg  45 mg Oral QHS Rachael FeeIrving A Neville Walston, MD      . naproxen (NAPROSYN) tablet 500 mg  500 mg  Oral BID PRN Kerry HoughSpencer E Simon, PA-C   500 mg at 01/01/14 0751  . nicotine (NICODERM CQ - dosed in mg/24 hours) patch 21 mg  21 mg Transdermal Q0600 Rachael FeeIrving A Paddy Walthall, MD   21 mg at 01/01/14 0618  . ondansetron (ZOFRAN-ODT) disintegrating tablet 4 mg  4 mg Oral Q6H PRN Kerry HoughSpencer E Simon, PA-C   4 mg at 12/31/13 1529    Lab Results: No results found for this or any previous visit (from the past 48 hour(s)).  Physical Findings: AIMS: Facial and Oral Movements Muscles of Facial Expression: None, normal Lips and Perioral Area: None, normal Jaw: None, normal Tongue: None, normal,Extremity Movements Upper (arms, wrists, hands, fingers): None, normal Lower (legs, knees, ankles, toes): None, normal, Trunk Movements Neck, shoulders, hips: None, normal, Overall Severity Severity of abnormal movements (highest score from questions above): None, normal Incapacitation due to abnormal movements: None, normal Patient's awareness of abnormal movements (rate only patient's report): No Awareness, Dental Status Current problems with teeth and/or dentures?: No Does patient usually wear dentures?: No  CIWA:  CIWA-Ar Total: 1 COWS:  COWS Total Score: 2  Treatment Plan Summary: Daily contact with patient to assess and evaluate symptoms and progress in treatment Medication management  Plan: Supportive approach/coping skills/relapse prevention           Optmize response to psychotropics           R/O possible akathisia secondary to the Abilify           Ativan 1 mg Q 8 H PRN  Medical Decision Making Problem Points:  New problem, with no additional work-up planned (3) and Review of psycho-social stressors (1) Data Points:  Review of medication regiment & side effects (2) Review of new medications or change in dosage (2)  I certify that inpatient services furnished can reasonably be expected to improve the patient's condition.   Shandee Jergens A 01/01/2014, 3:12 PM

## 2014-01-01 NOTE — [facility] Group Notes (Signed)
[facility] LCSW Group Therapy  01/01/2014 3:09 PM  Type of Therapy:  Group Therapy  Participation Level:  Did Not Attend-pt in room resting   Smart, Annie Roseboom LCSWA  01/01/2014, 3:09 PM

## 2014-01-01 NOTE — Progress Notes (Signed)
Patient ID: Alexander MaltaChristopher [name], male   DOB: 04/18/1976, 38 y.o.   MRN: [id number] D: Pt. Visible on milieu, seen in dayroom watching TV, minimal interaction. Pt. Reports depression and anxiety at "2" of 10, says he is ready to get back to work. A: Writer encouraged pt to report any changes in anxiety or depression levels, encouraged karaoke and reviewed medications. R: pt. Is safe on the unit, went to [employer].

## 2014-01-02 DIAGNOSIS — F4310 Post-traumatic stress disorder, unspecified: Secondary | ICD-10-CM

## 2014-01-02 DIAGNOSIS — F332 Major depressive disorder, recurrent severe without psychotic features: Principal | ICD-10-CM

## 2014-01-02 DIAGNOSIS — F1110 Opioid abuse, uncomplicated: Secondary | ICD-10-CM

## 2014-01-02 MED ORDER — ARIPIPRAZOLE 5 MG PO TABS: 5.0000 mg | ORAL_TABLET | Freq: Every day | ORAL | Status: DC

## 2014-01-02 MED ORDER — MIRTAZAPINE 45 MG PO TABS: 45.0000 mg | ORAL_TABLET | Freq: Every day | ORAL | Status: DC

## 2014-01-02 MED ORDER — FLUOXETINE HCL 20 MG PO CAPS: 20.0000 mg | ORAL_CAPSULE | Freq: Every day | ORAL | Status: DC

## 2014-01-02 NOTE — Progress Notes (Signed)
Adult Psychoeducational Group Note  Date:  01/02/2014 Time:  12:28 PM  Group Topic/Focus:  Relapse Prevention Planning:   The focus of this group is to define relapse and discuss the need for planning to combat relapse.  Participation Level:  Minimal  Participation Quality:  Appropriate, Drowsy and Resistant  Affect:  Appropriate and Flat  Cognitive:  Alert and Appropriate  Insight: Appropriate  Engagement in Group:  Limited  Modes of Intervention:  Discussion and Education  Additional Comments:  Pt attended group with minimal participation. Pt appeared flat and was resistant towards joining discussion with group. Pt was participating in group by nodding his head yes when he agreed with any material/topic discussed in group.    Dalia HeadingSeeley, Ava Tangney Aileen 01/02/2014, 12:28 PM

## 2014-01-02 NOTE — Tx Team (Signed)
Interdisciplinary Treatment Plan Update (Adult)  Date: 01/02/2014   Time Reviewed: 10:09 AM  Progress in Treatment:  Attending groups: Intermittently  Participating in groups:  Minimally  Taking medication as prescribed: Yes  Tolerating medication: Yes  Family/Significant othe contact made: No. Pt refused to consent to family contact. SPE completed with pt.  Patient understands diagnosis: Yes, AEB seeking treatment for med management, SI, and mood stabilization.  Discussing patient identified problems/goals with staff: Yes  Medical problems stabilized or resolved: Yes  Denies suicidal/homicidal ideation: Yes during group/self report.  Patient has not harmed self or Others: Yes  New problem(s) identified:  Discharge Plan or Barriers: Pt plans to return home at d/c and [facility] until his insurance coverage kicks in next month. He requested note for work allowing him to return without restrictions for Monday. (note in chart). Bus pass in chart per pt request.  Additional comments: 38 Y/O male who states he has a long history of having psychiatric problems. . He stop taking his medications as could not afford them. 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 As he has not been consistent wit his job due to the same he thinks he might have lost his job. States he has been having a lot of stressful situations happening. His mother went into a coma and had been in and out. In this area since Oct 2014. States he has couple of family members. He came for the job. States he has a past history of substance abuse as of recently he rinks on occasion to "self medicate." States he go so depressed that he asked some addicts to inject him to die. States they injected he thinks heroin in his veins. At the ED he stated he took an OD of sleeping pills. He has history of previous suicidal attempts, overdosing, ingesting mercury, cutting, jumping from a bridge  Reason for  Continuation of Hospitalization: d/c today  Estimated length of stay: x For review of initial/current patient goals, please see plan of care.  Attendees:  Patient:    Family:    Physician: Dr. Jama Flavorsobos MD 01/02/2014 10:08 AM   Nursing: Meryl DareJennifer P. RN 01/02/2014 10:08 AM   Clinical Social Worker Niasha Devins Smart, LCSWA  01/02/2014 10:08 AM   Other: Sue LushAndrea RN 01/02/2014 10:08 AM   Other: Chandra BatchAggie N. PA 01/02/2014 10:09 AM   Other: Darden DatesJennifer C. Nurse CM  01/02/2014 10:08 AM   Other:    Scribe for Treatment Team:  Herbert SetaHeather Smart LCSWA 01/02/2014 10:08 AM

## 2014-01-02 NOTE — Progress Notes (Signed)
[facility] Discharge [facility] :  Will you be returning to the same living situation after discharge: Yes,  home At discharge, do you have transportation home?:Yes,  bus (pass in chart) Do you have the ability to pay for your medications:Yes,  mental health  Release of information consent forms completed and CSW submitted to Medical Records.  Patient to Follow up at: Follow-up Information   Follow up with [facility]. (Walk in between 8am-9am Monday through Friday for hospital follow-up/medication management/assessment for therapy services. )    Contact information:   [address] [place], [place]  Phone: [phone] Fax: [phone]      Patient denies SI/HI:   Yes,  during group/self report.     Safety Planning and Suicide Prevention discussed:  Yes,  SPE completed with pt, as he refused to consent to family contact. SPI pamphlet provided to pt and he was encouraged to share information with support network, ask questions, and talk about any concerns relating to [employer]>  Smart, Irene Mitcham LCSWA  01/02/2014, 10:14 AM

## 2014-01-02 NOTE — Discharge Summary (Signed)
Physician Discharge Summary Note  Patient:  Alexander [name] is an 38 y.o., male MRN:  [id number] DOB:  1976/02/06 Patient phone:  [phone] (home)  Patient address:   [address]  [place] [address],  Total Time spent with patient: Greater than 30 minute  Date of Admission:  12/30/2013 Date of Discharge: 01/02/14  Reason for Admission:  Mood stable  Discharge Diagnoses: Active Problems:   MDD (major depressive disorder), recurrent severe, without psychosis   Post traumatic stress disorder (PTSD)   Psychiatric Specialty Exam: Physical Exam  Psychiatric: His speech is normal and behavior is normal. Judgment and thought content normal. His mood appears not anxious. His affect is not angry, not blunt, not labile and not inappropriate. Cognition and memory are normal. He does not exhibit a depressed mood.    Review of Systems  Constitutional: Negative.   HENT: Negative.   Eyes: Negative.   Respiratory: Negative.   Cardiovascular: Negative.   Gastrointestinal: Negative.   Genitourinary: Negative.   Musculoskeletal: Negative.   Skin: Negative.   Neurological: Negative.   Endo/Heme/Allergies: Negative.   Psychiatric/Behavioral: Positive for depression (Stable) and substance abuse (Hx of). Negative for suicidal ideas, hallucinations and memory loss. The patient has insomnia (Stable). The patient is not nervous/anxious.     Blood pressure 130/94, pulse 103, temperature 98.5 F (36.9 C), temperature source Oral, resp. rate 24, height 5' 8.11" (1.73 m), weight 86.183 kg (190 lb).Body mass index is 28.8 kg/(m^2).   General Appearance: Well Groomed  Patent attorney:: Good  Speech: Normal Rate  Volume: Normal  Mood: improved mood , states he feels less depressed  Affect: Appropriate and reactive  Thought Process: Linear  Orientation: Full (Time, Place, and Person)  Thought Content: no psychotic symptoms- no hallucinations and no delusions  Suicidal Thoughts: No- at this  time denies any thoughts of hurting himself or anyone else  Homicidal Thoughts: No  Memory: NA  Judgement: Fair  Insight: Fair  Psychomotor Activity: Normal  Concentration: Good  Recall: Good  Fund of Knowledge:Good  Language: Good  Akathisia: No  Handed: Right  AIMS (if indicated): Assets: Communication Skills  Desire for Improvement  Social Support  Vocational/Educational  Sleep: Number of Hours: 6.5   Past Psychiatric History: Diagnosis: MDD (major depressive disorder), recurrent severe, without psychosis, Opioid abuse, PTSD  Hospitalizations: [facility] adult  Outpatient Care: [facility]  Substance Abuse Care: NA  Self-Mutilation: NA  Suicidal Attempts: NA  Violent Behaviors: NA   Musculoskeletal: Strength & Muscle Tone: within normal limits Gait & Station: normal Patient leans: N/A  DSM5: Schizophrenia Disorders:  NA Obsessive-Compulsive Disorders:  NA Trauma-Stressor Disorders:  NA Substance/Addictive Disorders:  Opioid Disorder - Moderate (304.00) Depressive Disorders:  MDD (major depressive disorder), recurrent severe, without psychosis  Axis Diagnosis:  AXIS I:  Opioid use disorder, MDD (major depressive disorder), recurrent severe, without psychosis, PTSD AXIS II:  Deferred AXIS III:   Past Medical History  Diagnosis Date  . Chronic back pain   . Bipolar disorder   . Anxiety    AXIS IV:  other psychosocial or environmental problems and Mental illness, chronic AXIS V:  62  Level of Care:  OP  Hospital Course: 38 Y/O male who states he has a long history of having psychiatric problems. . He stop taking his medications as could not afford them. 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 As he has not been consistent wit his job due to the same  he thinks he might have lost his job. States he has been having a lot of stressful situations happening. His mother went into a coma and had been in and out.  Alexander [name] was admitted to  the hospital with his UDS test reports positive for opiates. He also was in need of mood stabilization as he had stopped taking his mental health medications because he could not afford. He needed opioid detoxification treatment as well as resuming medications for mood stabilization. He received Clonidine detox protocols. He was also medicated and discharged on Prozac 20 mg daily for depression, Abilify 5 mg for mood control, Hydroxyzine 25 mg three times daily as needed for anxiety and Mirtazapine 45 mg Q bedtime for depression/sleep. He also enrolled in the group counseling sessions and AA/NA meetings being offered on this unit. He learned coping skills. He tolerated his treatment regimen without any adverse effects and or reactions.  Alexander [name] has completed detox treatment and his mood is stable. This evidenced by his reports of improved mood and absence of withdrawal symptoms. He is currently being discharged to continue psychiatric treatment at the [facility] [place] in [place], [place]. He is provided with all the necessary information needed to make this appointment without problems.  Upon discharge, he adamantly denies any SIHI, AVH, delusional thoughts, paranoia and or withdrawal symptoms. [facility], a 14 days worth, supply samples of [facility]. [facility] with all belongings in no distress. Transportation per city bus.  Consults:  psychiatry  Significant Diagnostic Studies:  labs: CBC with diff, CMP, UDS, toxicology tests, U/A  Discharge Vitals:   Blood pressure 130/94, pulse 103, temperature 98.5 F (36.9 C), temperature source Oral, resp. rate 24, height 5' 8.11" (1.73 m), weight 86.183 kg (190 lb). Body mass index is 28.8 kg/(m^2). Lab Results:   No results found for this or any previous visit (from the past 72 hour(s)).  Physical Findings: AIMS: Facial and Oral Movements Muscles of Facial Expression: None, normal Lips and Perioral Area:  None, normal Jaw: None, normal Tongue: None, normal,Extremity Movements Upper (arms, wrists, hands, fingers): None, normal Lower (legs, knees, ankles, toes): None, normal, Trunk Movements Neck, shoulders, hips: None, normal, Overall Severity Severity of abnormal movements (highest score from questions above): None, normal Incapacitation due to abnormal movements: None, normal Patient's awareness of abnormal movements (rate only patient's report): No Awareness, Dental Status Current problems with teeth and/or dentures?: No Does patient usually wear dentures?: No  CIWA:  CIWA-Ar Total: 1 COWS:  COWS Total Score: 1  Psychiatric Specialty Exam: See Psychiatric Specialty Exam and Suicide Risk Assessment completed by Attending Physician prior to discharge.  Discharge destination:  Home  Is patient on multiple antipsychotic therapies at discharge:  No   Has Patient had three or more failed trials of antipsychotic monotherapy by history:  No  Recommended Plan for Multiple Antipsychotic Therapies: NA    Medication List       Indication   ARIPiprazole 5 MG tablet  Commonly known as:  ABILIFY  Take 1 tablet (5 mg total) by mouth daily. For mood control   Indication:  Mood control     FLUoxetine 20 MG capsule  Commonly known as:  PROZAC  Take 1 capsule (20 mg total) by mouth daily. For depression   Indication:  Depression     mirtazapine 45 MG tablet  Commonly known as:  REMERON  Take 1 tablet (45 mg total) by mouth at bedtime. For depression/sleep  Indication:  Trouble Sleeping, Major Depressive Disorder           Follow-up Information   Follow up with [facility]. (Walk in between 8am-9am Monday through Friday for hospital follow-up/medication management/assessment for therapy services. )    Contact information:   [address] [place], [place]  Phone: [phone] Fax: [phone]     Follow-up recommendations: Activity:  As tolerated Diet: As recommended by your  primary care doctor. Keep all scheduled follow-up appointments as recommended.  Comments:  Take all your medications as prescribed by your mental healthcare provider. Report any adverse effects and or reactions from your medicines to your outpatient provider promptly. Patient is instructed and cautioned to not engage in alcohol and or illegal drug use while on prescription medicines. In the event of worsening symptoms, patient is instructed to call the crisis hotline, 911 and or go to the nearest ED for appropriate evaluation and treatment of symptoms. Follow-up with your primary care provider for your other medical issues, concerns and or health care needs.   Total Discharge Time:  Greater than 30 minutes.  Signed: Armandina Stammer I, PMHNP-BC 01/02/2014, 10:14 AM  I personally assessed the patient and formulated the plan Madie Reno A. Dub Mikes, M.D.

## 2014-01-02 NOTE — [facility] Suicide Risk Assessment (Signed)
   Demographic Factors:  38 year old man, single, lives alone at this time, recently from [place], employed  Total Time spent with patient: 30 minutes  Psychiatric Specialty Exam: Physical Exam  ROS  Blood pressure 130/94, pulse 103, temperature 98.5 F (36.9 C), temperature source Oral, resp. rate 24, height 5' 8.11" (1.73 m), weight 86.183 kg (190 lb).Body mass index is 28.8 kg/(m^2).  General Appearance: Well Groomed  Patent attorneyye Contact::  Good  Speech:  Normal Rate  Volume:  Normal  Mood:  improved mood , states he feels less depressed  Affect:  Appropriate and reactive   Thought Process:  Linear  Orientation:  Full (Time, Place, and Person)  Thought Content:  no psychotic symptoms- no hallucinations and no delusions  Suicidal Thoughts:  No- at this time denies any thoughts of hurting himself or anyone else  Homicidal Thoughts:  No  Memory:  NA  Judgement:  Fair  Insight:  Fair  Psychomotor Activity:  Normal  Concentration:  Good  Recall:  Good  Fund of Knowledge:Good  Language: Good  Akathisia:  No  Handed:  Right  AIMS (if indicated):     Assets:  Communication Skills Desire for Improvement Social Support Vocational/Educational  Sleep:  Number of Hours: 6.5    Musculoskeletal: Strength & Muscle Tone: within normal limits Gait & Station: normal Patient leans: N/A   Mental Status Per Nursing Assessment::   On Admission:  Self-harm thoughts;Self-harm behaviors  Current Mental Status by Physician: At this time patient is feeling better, less depressed, with an improved affect, not suicidal or homicidal , not psychotic, future oriented  Loss Factors: Transportation issues difficulting going to work/ sustain employment . Family in [place]  Historical Factors: Prior history of suicide attempts. History of substance abuse   Risk Reduction Factors:   Responsible for children under 40 years of age, Sense of responsibility to family, Employed and Positive  social support  Continued Clinical Symptoms:  Bipolar Disorder:   Depressive phase/ Opiate Abuse.  Cognitive Features That Contribute To Risk:  No gross cognitive deficits noted upon discharge  Suicide Risk:  Mild:  Suicidal ideation of limited frequency, intensity, duration, and specificity.  There are no identifiable plans, no associated intent, mild dysphoria and related symptoms, good self-control (both objective and subjective assessment), few other risk factors, and identifiable protective factors, including available and accessible social support.  Discharge Diagnoses:   AXIS I:  Bipolar, Depressed and Substance Abuse AXIS II:  Deferred AXIS III:   Past Medical History  Diagnosis Date  . Chronic back pain   . Bipolar disorder   . Anxiety    AXIS IV:  transportation issues and family out of state AXIS V:  51-60 moderate symptoms ( 60 upon discharge)   Plan Of Care/Follow-up recommendations:  Activity:  As tolerated  Diet:  Regular Tests:  NA Other:  See below  Is patient on multiple antipsychotic therapies at discharge:  No   Has Patient had three or more failed trials of antipsychotic monotherapy by history:  No  Recommended Plan for Multiple Antipsychotic Therapies: NA  Patient is leaving clinic in good spirits. Plans to return home, [facility] [employer] Plans to [facility] Plans to continue going to [employer]. Encouraged to get a sponsor.    COBOS, FERNANDO 01/02/2014, 1:18 PM

## 2014-01-02 NOTE — Progress Notes (Signed)
Patient ID: Alexander MaltaChristopher Putz, male   DOB: 1975/07/07, 38 y.o.   MRN: [id number]   D: Patient reports much improvement since back on meds. Mood improved and denies any SI at present.Feels ready for discharge. A: Obtained all belongings, medications, prescriptions, bus pass, and is to [facility]. R: Let out to lobby and explained how to get to bus stiop.

## 2014-01-02 NOTE — BHH Group Notes (Signed)
[facility] LCSW Aftercare Discharge Planning Group Note   01/02/2014 9:45 AM  Participation Quality:  Appropriate   Mood/Affect:  Appropriate  Depression Rating:  3  Anxiety Rating:  3  Thoughts of Suicide:  No Will you contract for safety?   NA  Current AVH:  No  Plan for Discharge/Comments:  Pt reports that he is ready for d/c today. He [facility] until his insurance kicks in next month. Pt requesting return to work letter-in chart. Pt in need of bus pass (in chart) and is reporting no withdrawal symptoms.   Transportation Means: bus-pass in chart   Supports: some family/friend supports.   Smart, American FinancialHeather LCSWA

## 2014-01-07 NOTE — Progress Notes (Signed)
Patient Discharge Instructions:  After Visit Summary (AVS):   Faxed to:  01/07/14 Discharge Summary Note:   Faxed to:  01/07/14 Psychiatric Admission Assessment Note:   Faxed to:  01/07/14 Suicide Risk Assessment - Discharge Assessment:   Faxed to:  01/07/14 Faxed/Sent to the Next Level Care provider:  01/07/14 [facility]ax[facility] to [facility] @ [phone]  Jerelene ReddenSheena E Blue Ball, 01/07/2014, 3:38 PM

## 2014-01-19 ENCOUNTER — Emergency Department (HOSPITAL_COMMUNITY)
Admission: EM | Admit: 2014-01-19 | Discharge: 2014-01-20 | Disposition: A | Discharge status: Other Acute Inpatient Hospital | Payer: Self-pay | Attending: Emergency Medicine | Admitting: Emergency Medicine

## 2014-01-19 ENCOUNTER — Encounter (HOSPITAL_COMMUNITY): Payer: Self-pay | Admitting: Emergency Medicine

## 2014-01-19 DIAGNOSIS — Z79899 Other long term (current) drug therapy: Secondary | ICD-10-CM | POA: Insufficient documentation

## 2014-01-19 DIAGNOSIS — T398X2A Poisoning by other nonopioid analgesics and antipyretics, not elsewhere classified, intentional self-harm, initial encounter: Secondary | ICD-10-CM

## 2014-01-19 DIAGNOSIS — F17200 Nicotine dependence, unspecified, uncomplicated: Secondary | ICD-10-CM | POA: Insufficient documentation

## 2014-01-19 DIAGNOSIS — T43502A Poisoning by unspecified antipsychotics and neuroleptics, intentional self-harm, initial encounter: Secondary | ICD-10-CM | POA: Insufficient documentation

## 2014-01-19 DIAGNOSIS — T43501A Poisoning by unspecified antipsychotics and neuroleptics, accidental (unintentional), initial encounter: Secondary | ICD-10-CM | POA: Insufficient documentation

## 2014-01-19 DIAGNOSIS — F411 Generalized anxiety disorder: Secondary | ICD-10-CM | POA: Insufficient documentation

## 2014-01-19 DIAGNOSIS — T438X2A Poisoning by other psychotropic drugs, intentional self-harm, initial encounter: Secondary | ICD-10-CM | POA: Insufficient documentation

## 2014-01-19 DIAGNOSIS — T394X2A Poisoning by antirheumatics, not elsewhere classified, intentional self-harm, initial encounter: Secondary | ICD-10-CM | POA: Insufficient documentation

## 2014-01-19 DIAGNOSIS — R45851 Suicidal ideations: Secondary | ICD-10-CM | POA: Insufficient documentation

## 2014-01-19 DIAGNOSIS — G8929 Other chronic pain: Secondary | ICD-10-CM | POA: Insufficient documentation

## 2014-01-19 DIAGNOSIS — T50902A Poisoning by unspecified drugs, medicaments and biological substances, intentional self-harm, initial encounter: Secondary | ICD-10-CM

## 2014-01-19 DIAGNOSIS — R443 Hallucinations, unspecified: Secondary | ICD-10-CM | POA: Insufficient documentation

## 2014-01-19 DIAGNOSIS — F1110 Opioid abuse, uncomplicated: Secondary | ICD-10-CM | POA: Insufficient documentation

## 2014-01-19 DIAGNOSIS — F319 Bipolar disorder, unspecified: Secondary | ICD-10-CM | POA: Insufficient documentation

## 2014-01-19 DIAGNOSIS — T40601A Poisoning by unspecified narcotics, accidental (unintentional), initial encounter: Secondary | ICD-10-CM | POA: Insufficient documentation

## 2014-01-19 LAB — COMPREHENSIVE METABOLIC PANEL
ALT: 13 U/L (ref 0–53)
AST: 11 U/L (ref 0–37)
Albumin: 3.4 g/dL — ABNORMAL LOW (ref 3.5–5.2)
Alkaline Phosphatase: 138 U/L — ABNORMAL HIGH (ref 39–117)
Anion gap: 15 (ref 5–15)
BUN: 8 mg/dL (ref 6–23)
CO2: 22 mEq/L (ref 19–32)
CREATININE: 1.23 mg/dL (ref 0.50–1.35)
Calcium: 9.8 mg/dL (ref 8.4–10.5)
Chloride: 106 mEq/L (ref 96–112)
GFR calc non Af Amer: 72 mL/min — ABNORMAL LOW (ref >90)
GFR, EST AFRICAN AMERICAN: 83 mL/min — AB (ref >90)
Glucose, Bld: 110 mg/dL — ABNORMAL HIGH (ref 70–99)
Potassium: 3.2 mEq/L — ABNORMAL LOW (ref 3.7–5.3)
SODIUM: 143 meq/L (ref 137–147)
TOTAL PROTEIN: 6.9 g/dL (ref 6.0–8.3)
Total Bilirubin: 0.3 mg/dL (ref 0.3–1.2)

## 2014-01-19 LAB — CBC WITH DIFFERENTIAL/PLATELET
BASOS ABS: 0 10*3/uL (ref 0.0–0.1)
Basophils Relative: 0 % (ref 0–1)
EOS PCT: 2 % (ref 0–5)
Eosinophils Absolute: 0.1 10*3/uL (ref 0.0–0.7)
HCT: 38.1 % — ABNORMAL LOW (ref 39.0–52.0)
Hemoglobin: 13.2 g/dL (ref 13.0–17.0)
Lymphocytes Relative: 31 % (ref 12–46)
Lymphs Abs: 2.1 10*3/uL (ref 0.7–4.0)
MCH: 29.4 pg (ref 26.0–34.0)
MCHC: 34.6 g/dL (ref 30.0–36.0)
MCV: 84.9 fL (ref 78.0–100.0)
MONO ABS: 0.7 10*3/uL (ref 0.1–1.0)
Monocytes Relative: 10 % (ref 3–12)
Neutro Abs: 3.9 10*3/uL (ref 1.7–7.7)
Neutrophils Relative %: 57 % (ref 43–77)
PLATELETS: 233 10*3/uL (ref 150–400)
RBC: 4.49 MIL/uL (ref 4.22–5.81)
RDW: 13.5 % (ref 11.5–15.5)
WBC: 6.9 10*3/uL (ref 4.0–10.5)

## 2014-01-19 LAB — ETHANOL

## 2014-01-19 LAB — SALICYLATE LEVEL

## 2014-01-19 LAB — RAPID URINE DRUG SCREEN, HOSP PERFORMED
Amphetamines: NOT DETECTED
BARBITURATES: NOT DETECTED
BENZODIAZEPINES: NOT DETECTED
Cocaine: NOT DETECTED
Opiates: POSITIVE — AB
Tetrahydrocannabinol: NOT DETECTED

## 2014-01-19 LAB — ACETAMINOPHEN LEVEL

## 2014-01-19 MED ORDER — MIRTAZAPINE 30 MG PO TABS
45.0000 mg | ORAL_TABLET | Freq: Every day | ORAL | Status: DC
  Administered 2014-01-19: 45 mg via ORAL
  Filled 2014-01-19: qty 2

## 2014-01-19 MED ORDER — POTASSIUM CHLORIDE CRYS ER 20 MEQ PO TBCR
40.0000 meq | EXTENDED_RELEASE_TABLET | Freq: Once | ORAL | Status: AC
  Administered 2014-01-19: 40 meq via ORAL
  Filled 2014-01-19: qty 2

## 2014-01-19 MED ORDER — FLUOXETINE HCL 20 MG PO CAPS
20.0000 mg | ORAL_CAPSULE | Freq: Every day | ORAL | Status: DC
  Administered 2014-01-19: 20 mg via ORAL
  Filled 2014-01-19: qty 1

## 2014-01-19 MED ORDER — ARIPIPRAZOLE 5 MG PO TABS
5.0000 mg | ORAL_TABLET | Freq: Every day | ORAL | Status: DC
  Administered 2014-01-19: 5 mg via ORAL
  Filled 2014-01-19: qty 1

## 2014-01-19 NOTE — ED Provider Notes (Signed)
CSN: [id number]     Arrival date & time 01/19/14  1756 History   First MD Initiated Contact with Patient 01/19/14 1920     Chief Complaint  Patient presents with  . hallucatinations, depression      (Consider location/radiation/quality/duration/timing/severity/associated sxs/prior Treatment) Patient is a 38 y.o. male presenting with mental health disorder. The history is provided by the patient.  Mental Health Problem Presenting symptoms: suicidal thoughts   Degree of incapacity (severity):  Moderate Onset quality:  Gradual Duration:  2 days Timing:  Constant Progression:  Unchanged Chronicity:  Recurrent Context: alcohol use (6 beers yesterday) and drug abuse (morphine pill yesterday)   Treatment compliance:  Most of the time Time since last dose of psychoactive medication: 2 days. Relieved by:  Nothing Worsened by:  Nothing tried Ineffective treatments:  None tried Associated symptoms: no abdominal pain, no chest pain and no headaches     Past Medical History  Diagnosis Date  . Chronic back pain   . Bipolar disorder   . Anxiety    Past Surgical History  Procedure Laterality Date  . Hernia repair     History reviewed. No pertinent family history. History  Substance Use Topics  . Smoking status: Current Every Day Smoker -- 1.00 packs/day    Types: Cigarettes  . Smokeless tobacco: Never Used  . Alcohol Use: No     Comment: occ    Review of Systems  Constitutional: Negative for fever.  HENT: Negative for drooling and rhinorrhea.   Eyes: Negative for pain.  Respiratory: Negative for cough and shortness of breath.   Cardiovascular: Negative for chest pain and leg swelling.  Gastrointestinal: Negative for nausea, vomiting, abdominal pain and diarrhea.  Genitourinary: Negative for dysuria and hematuria.  Musculoskeletal: Negative for gait problem and neck pain.  Skin: Negative for color change.  Neurological: Negative for numbness and headaches.  Hematological:  Negative for adenopathy.  Psychiatric/Behavioral: Positive for suicidal ideas. Negative for behavioral problems.  All other systems reviewed and are negative.     Allergies  Review of patient's allergies indicates no known allergies.  Home Medications   Prior to Admission medications   Medication Sig Start Date End Date Taking? Authorizing Provider  ARIPiprazole (ABILIFY) 5 MG tablet Take 1 tablet (5 mg total) by mouth daily. For mood control 01/02/14   Sanjuana Kava, NP  FLUoxetine (PROZAC) 20 MG capsule Take 1 capsule (20 mg total) by mouth daily. For depression 01/02/14   Sanjuana Kava, NP  mirtazapine (REMERON) 45 MG tablet Take 1 tablet (45 mg total) by mouth at bedtime. For depression/sleep 01/02/14   Sanjuana Kava, NP   BP 80/52  Pulse 93  Temp(Src) 97.5 F (36.4 C) (Oral)  Resp 20  SpO2 97% Physical Exam  Nursing note and vitals reviewed. Constitutional: He is oriented to person, place, and time. He appears well-developed and well-nourished.  HENT:  Head: Normocephalic and atraumatic.  Right Ear: External ear normal.  Left Ear: External ear normal.  Nose: Nose normal.  Mouth/Throat: Oropharynx is clear and moist. No oropharyngeal exudate.  Eyes: Conjunctivae and EOM are normal. Pupils are equal, round, and reactive to light.  Neck: Normal range of motion. Neck supple.  Cardiovascular: Normal rate, regular rhythm, normal heart sounds and intact distal pulses.  Exam reveals no gallop and no friction rub.   No murmur heard. Pulmonary/Chest: Effort normal and breath sounds normal. No respiratory distress. He has no wheezes.  Abdominal: Soft. Bowel sounds are normal. He  exhibits no distension. There is no tenderness. There is no rebound and no guarding.  Musculoskeletal: Normal range of motion. He exhibits no edema and no tenderness.  Neurological: He is alert and oriented to person, place, and time.  Skin: Skin is warm and dry.  Psychiatric: His behavior is normal. He  exhibits a depressed mood.    ED Course  Procedures (including critical care time) Labs Review Labs Reviewed  CBC WITH DIFFERENTIAL - Abnormal; Notable for the following:    HCT 38.1 (*)    All other components within normal limits  COMPREHENSIVE METABOLIC PANEL - Abnormal; Notable for the following:    Potassium 3.2 (*)    Glucose, Bld 110 (*)    Albumin 3.4 (*)    Alkaline Phosphatase 138 (*)    GFR calc non Af Amer 72 (*)    GFR calc Af Amer 83 (*)    All other components within normal limits  SALICYLATE LEVEL - Abnormal; Notable for the following:    Salicylate Lvl <2.0 (*)    All other components within normal limits  ACETAMINOPHEN LEVEL  ETHANOL  URINE RAPID DRUG SCREEN (HOSP PERFORMED)    Imaging Review No results found.   EKG Interpretation   Date/Time:  Monday January 19 2014 20:05:28 EDT Ventricular Rate:  79 PR Interval:  151 QRS Duration: 90 QT Interval:  373 QTC Calculation: 428 R Axis:   87 Text Interpretation:  Sinus rhythm ST elev, probable normal early repol  pattern No significant change since last tracing Confirmed by Fairley Copher   MD, Farhad Burleson ([id number]) on 01/19/2014 8:35:40 PM      MDM   Final diagnoses:  Overdose, intentional self-harm, initial encounter  Suicidal ideations    7:31 PM 38 y.o. male  w a hx of bipolar, anxiety who presents with suicidal ideations. He is depressed due to the fact that he cannot afford his medications. He states that he is been off his psych meds for 2 days. He states that yesterday he took 12 Seroquel, 1 tablet morphine, and drinks 6 beers. He states that he was indirectly trying to hurt himself. He has no specific plan for himself currently but does feel suicidal. Will get screening labs.  Potassium slightly low. Gave oral K-dur to supplement. BP has improved significantly, 111/77 on my exam now. Pt continues to appear well. Has chronic right shoulder pain.   The patient is medically cleared. TTS has evaluated the  patient and [facility] Will transfer.     Junius ArgyleForrest S Nikyla Navedo, MD 01/19/14 2238
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
## 2014-01-19 NOTE — ED Notes (Signed)
Dr Harrison at bedside

## 2014-01-19 NOTE — ED Notes (Signed)
[facility] Expected date:  Expected time:  Means of arrival:  Comments: tr5

## 2014-01-19 NOTE — BH Assessment (Signed)
Assessment completed. Consulted Donell SievertSpencer Simon, [place] who recommended inpatient treatment. [facility] Bed 1. Dr. Romeo AppleHarrison has been notified of recommendation and [facility].

## 2014-01-19 NOTE — BH Assessment (Signed)
Spoke with Dr. Romeo AppleHarrison who reported that pt was [facility] 1 month ago and is depressed due to being unable to get his prescriptions. Pt reported that he drank some beers and took some Seroquel that belonged to his friend on yesterday. Pt is endorsing SI. Assessment will be initiated.

## 2014-01-19 NOTE — ED Notes (Signed)
Patient wanded and belongings searched by security 

## 2014-01-19 NOTE — ED Notes (Signed)
Pt reports to ED for mental health issues resulting from being unable to obtain his psychiatric medication or his pain medication for his chronic back pain. Patient denies SI or HI, states he does not currently want to hurt or kill himself, but that he wants to get help before he comes to the point where he does hurt himself, such as cutting his arms. Patient has significant deep scarring to forearms. Pt states that he hears voices faintly calling his name, denies voices telling him to do anything.

## 2014-01-20 ENCOUNTER — Inpatient Hospital Stay (HOSPITAL_COMMUNITY)
Admission: EM | Admit: 2014-01-20 | Discharge: 2014-01-26 | DRG: 897 | Disposition: A | Discharge status: Rehabilitation Facility | Payer: No Typology Code available for payment source | Source: Intra-hospital | Attending: Psychiatry | Admitting: Psychiatry

## 2014-01-20 ENCOUNTER — Encounter (HOSPITAL_COMMUNITY): Payer: Self-pay

## 2014-01-20 DIAGNOSIS — F1024 Alcohol dependence with alcohol-induced mood disorder: Secondary | ICD-10-CM

## 2014-01-20 DIAGNOSIS — Z5987 Material hardship due to limited financial resources, not elsewhere classified: Secondary | ICD-10-CM

## 2014-01-20 DIAGNOSIS — F39 Unspecified mood [affective] disorder: Secondary | ICD-10-CM | POA: Diagnosis present

## 2014-01-20 DIAGNOSIS — F1120 Opioid dependence, uncomplicated: Secondary | ICD-10-CM | POA: Diagnosis present

## 2014-01-20 DIAGNOSIS — F17200 Nicotine dependence, unspecified, uncomplicated: Secondary | ICD-10-CM | POA: Diagnosis present

## 2014-01-20 DIAGNOSIS — F3163 Bipolar disorder, current episode mixed, severe, without psychotic features: Secondary | ICD-10-CM | POA: Diagnosis present

## 2014-01-20 DIAGNOSIS — F1020 Alcohol dependence, uncomplicated: Secondary | ICD-10-CM | POA: Diagnosis present

## 2014-01-20 DIAGNOSIS — F1994 Other psychoactive substance use, unspecified with psychoactive substance-induced mood disorder: Secondary | ICD-10-CM | POA: Diagnosis present

## 2014-01-20 DIAGNOSIS — M549 Dorsalgia, unspecified: Secondary | ICD-10-CM | POA: Diagnosis present

## 2014-01-20 DIAGNOSIS — G4700 Insomnia, unspecified: Secondary | ICD-10-CM | POA: Diagnosis present

## 2014-01-20 DIAGNOSIS — Z5989 Other problems related to housing and economic circumstances: Secondary | ICD-10-CM | POA: Diagnosis not present

## 2014-01-20 DIAGNOSIS — Z598 Other problems related to housing and economic circumstances: Secondary | ICD-10-CM

## 2014-01-20 DIAGNOSIS — R45851 Suicidal ideations: Secondary | ICD-10-CM

## 2014-01-20 DIAGNOSIS — F411 Generalized anxiety disorder: Secondary | ICD-10-CM | POA: Diagnosis present

## 2014-01-20 DIAGNOSIS — F4310 Post-traumatic stress disorder, unspecified: Secondary | ICD-10-CM

## 2014-01-20 DIAGNOSIS — G8929 Other chronic pain: Secondary | ICD-10-CM | POA: Diagnosis present

## 2014-01-20 DIAGNOSIS — F332 Major depressive disorder, recurrent severe without psychotic features: Secondary | ICD-10-CM

## 2014-01-20 DIAGNOSIS — T1491XA Suicide attempt, initial encounter: Secondary | ICD-10-CM

## 2014-01-20 MED ORDER — ARIPIPRAZOLE 10 MG PO TABS
10.0000 mg | ORAL_TABLET | Freq: Every day | ORAL | Status: DC
  Administered 2014-01-21 – 2014-01-26 (×6): 10 mg via ORAL
  Filled 2014-01-20 (×7): qty 1

## 2014-01-20 MED ORDER — METHOCARBAMOL 500 MG PO TABS
500.0000 mg | ORAL_TABLET | Freq: Three times a day (TID) | ORAL | Status: AC | PRN
  Administered 2014-01-21 – 2014-01-23 (×3): 500 mg via ORAL
  Filled 2014-01-20 (×3): qty 1

## 2014-01-20 MED ORDER — ONDANSETRON 4 MG PO TBDP
4.0000 mg | ORAL_TABLET | Freq: Four times a day (QID) | ORAL | Status: AC | PRN
Start: 2014-01-20 — End: 2014-01-25

## 2014-01-20 MED ORDER — IBUPROFEN 600 MG PO TABS
600.0000 mg | ORAL_TABLET | Freq: Four times a day (QID) | ORAL | Status: DC | PRN
  Administered 2014-01-20 – 2014-01-23 (×5): 600 mg via ORAL
  Filled 2014-01-20 (×5): qty 1

## 2014-01-20 MED ORDER — LOPERAMIDE HCL 2 MG PO CAPS
2.0000 mg | ORAL_CAPSULE | ORAL | Status: AC | PRN
  Administered 2014-01-20: 4 mg via ORAL
  Filled 2014-01-20: qty 2

## 2014-01-20 MED ORDER — CLONIDINE HCL 0.1 MG PO TABS
0.1000 mg | ORAL_TABLET | Freq: Every day | ORAL | Status: AC
  Administered 2014-01-24 – 2014-01-25 (×2): 0.1 mg via ORAL
  Filled 2014-01-20 (×2): qty 1

## 2014-01-20 MED ORDER — HYDROXYZINE HCL 25 MG PO TABS
25.0000 mg | ORAL_TABLET | Freq: Four times a day (QID) | ORAL | Status: DC | PRN
  Administered 2014-01-20 – 2014-01-25 (×8): 25 mg via ORAL
  Filled 2014-01-20 (×9): qty 1

## 2014-01-20 MED ORDER — NICOTINE 21 MG/24HR TD PT24
21.0000 mg | MEDICATED_PATCH | Freq: Every day | TRANSDERMAL | Status: DC
  Administered 2014-01-20 – 2014-01-26 (×6): 21 mg via TRANSDERMAL
  Filled 2014-01-20 (×7): qty 1

## 2014-01-20 MED ORDER — MAGNESIUM HYDROXIDE 400 MG/5ML PO SUSP: 30.0000 mL | Freq: Every day | ORAL | Status: DC | PRN

## 2014-01-20 MED ORDER — CLONIDINE HCL 0.1 MG PO TABS
0.1000 mg | ORAL_TABLET | ORAL | Status: AC
  Administered 2014-01-22 – 2014-01-24 (×4): 0.1 mg via ORAL
  Filled 2014-01-20 (×4): qty 1

## 2014-01-20 MED ORDER — FLUOXETINE HCL 20 MG PO CAPS
20.0000 mg | ORAL_CAPSULE | Freq: Every day | ORAL | Status: DC
  Administered 2014-01-20 – 2014-01-21 (×2): 20 mg via ORAL
  Filled 2014-01-20 (×5): qty 1

## 2014-01-20 MED ORDER — POTASSIUM CHLORIDE CRYS ER 20 MEQ PO TBCR
20.0000 meq | EXTENDED_RELEASE_TABLET | Freq: Two times a day (BID) | ORAL | Status: AC
  Administered 2014-01-20 – 2014-01-21 (×4): 20 meq via ORAL
  Filled 2014-01-20 (×4): qty 1

## 2014-01-20 MED ORDER — ALUM & MAG HYDROXIDE-SIMETH 200-200-20 MG/5ML PO SUSP: 30.0000 mL | ORAL | Status: DC | PRN

## 2014-01-20 MED ORDER — ACETAMINOPHEN 325 MG PO TABS: 650.0000 mg | ORAL_TABLET | Freq: Four times a day (QID) | ORAL | Status: DC | PRN

## 2014-01-20 MED ORDER — MIRTAZAPINE 45 MG PO TABS
45.0000 mg | ORAL_TABLET | Freq: Every day | ORAL | Status: DC
  Administered 2014-01-20 – 2014-01-21 (×2): 45 mg via ORAL
  Filled 2014-01-20 (×5): qty 1

## 2014-01-20 MED ORDER — CLONIDINE HCL 0.1 MG PO TABS
0.1000 mg | ORAL_TABLET | Freq: Four times a day (QID) | ORAL | Status: AC
  Administered 2014-01-20 – 2014-01-21 (×7): 0.1 mg via ORAL
  Filled 2014-01-20 (×7): qty 1

## 2014-01-20 MED ORDER — DICYCLOMINE HCL 20 MG PO TABS
20.0000 mg | ORAL_TABLET | Freq: Four times a day (QID) | ORAL | Status: AC | PRN
  Administered 2014-01-20 – 2014-01-21 (×2): 20 mg via ORAL
  Filled 2014-01-20 (×2): qty 1

## 2014-01-20 MED ORDER — LORAZEPAM 1 MG PO TABS
1.0000 mg | ORAL_TABLET | Freq: Four times a day (QID) | ORAL | Status: AC | PRN
  Administered 2014-01-20 – 2014-01-23 (×8): 1 mg via ORAL
  Filled 2014-01-20 (×9): qty 1

## 2014-01-20 MED ORDER — ARIPIPRAZOLE 5 MG PO TABS
5.0000 mg | ORAL_TABLET | Freq: Every day | ORAL | Status: DC
  Administered 2014-01-20: 5 mg via ORAL
  Filled 2014-01-20 (×4): qty 1

## 2014-01-20 MED ORDER — TRAZODONE HCL 50 MG PO TABS
50.0000 mg | ORAL_TABLET | Freq: Every evening | ORAL | Status: DC | PRN
  Filled 2014-01-20 (×7): qty 1

## 2014-01-20 NOTE — Progress Notes (Signed)
Adult Psychoeducational Group Note  Date:  01/20/2014 Time:  10:50 PM  Group Topic/Focus:  Goals Group:   The focus of this group is to help patients establish daily goals to achieve during treatment and discuss how the patient can incorporate goal setting into their daily lives to aide in recovery.  Participation Level:  Active  Participation Quality:  Appropriate  Affect:  Appropriate  Cognitive:  Appropriate  Insight: Appropriate  Engagement in Group:  Engaged  Modes of Intervention:  Discussion  Additional Comments:  Pt stated that he found a treatment center and is looking forward to going when he is DC from here.  Terie PurserParker, Brytni Dray R 01/20/2014, 10:50 PM

## 2014-01-20 NOTE — BHH Counselor (Signed)
Adult Psychosocial Assessment Update Interdisciplinary Team  Previous Behavior Health Hospital admissions/discharges:  Admissions Discharges  Date:   12/30/13 Date: 01/02/14  Date: Date:  Date: Date:  Date: Date:  Date: Date:   Changes since the last Psychosocial Assessment (including adherence to outpatient mental health and/or substance abuse treatment, situational issues contributing to decompensation and/or relapse). Alexander [name] is an 38 y.o. male [facility] due to suicidal ideations and worsening depression. Pt stated 'I took a bunch of Seroquel yesterday and I wanted my heart to stop".  Pt is currently endorsing SI; however does not want to share his plan. Pt stated "when I leave here I have a plan but I am not going to tell you that ma'am". Pt reported that he has attempted suicide multiple times in the past. Pt shared that he has cut himself multiple times and jumped in a river. Pt denies having a family history of suicide but did share that he has been receiving psychiatric services since 1989. Pt reported that [facility] but was unable to afford his medication. Pt is dealing with multiple stressors such as financial problems and trying to find permanent housing. Pt is endorsing depressive symptoms and shared that he is only sleeping 2 hours a night and when he tries to take a nap during the day he can only sleep for 20 minutes. Pt denies HI, AH and VH at this time. Pt denied having any criminal charges or upcoming court dates. Pt reported that he has access to weapons and stated "I'm wearing them; my hands; I have a black belt". Pt reported occasional alcohol use and shared that he may have 6 beers within a year. Pt did not report any illicit substance use.               Discharge Plan 1. Will you be returning to the same living situation after discharge?   Yes: No:      If no, what is your plan?    No.  Patient advised he had been living in  a half-way house and will need to go into residential treatment before being allowed to return.       2. Would you like a referral for services when you are discharged? Yes:     If yes, for what services?  No:       Yes.  Patient is interested in residential treatment       Summary and Recommendations (to be completed by the evaluator) Alexander [name] is a 38 years old Caucasian male admitted with Bipolar Disorder.  He will benefit from crisis stabilization, evaluation for medication, psycho-education groups for coping skills development, group therapy and case management for discharge planning.                        Signature:  Wynn BankerHodnett, Laurice Kimmons Hairston, 01/20/2014 10:53 AM

## 2014-01-20 NOTE — Tx Team (Signed)
Interdisciplinary Treatment Plan Update   Date Reviewed:  01/20/2014  Time Reviewed:  10:51 AM  Progress in Treatment:   Attending groups: Yes Participating in groups: Yes Taking medication as prescribed: Yes  Tolerating medication: Yes Family/Significant other contact made:  No, patient declined collateral contact Patient understands diagnosis: Yes  Discussing patient identified problems/goals with staff: Yes Medical problems stabilized or resolved: Yes Denies suicidal/homicidal ideation: Yes Patient has not harmed self or others: Yes  For review of initial/current patient goals, please see plan of care.  Estimated Length of Stay:  3-5 days  Reasons for Continued Hospitalization:  Anxiety Depression Medication stabilization   New Problems/Goals identified:    Discharge Plan or Barriers:   Home with outpatient follow up to be to be determined  Additional Comments:  Attendees:  Patient:  01/20/2014 10:51 AM   Signature:  Sallyanne HaversF. Cobos, MD 01/20/2014 10:51 AM  Signature:  01/20/2014 10:51 AM  Signature:   01/20/2014 10:51 AM  Signature:Beverly Terrilee CroakKnight, RN 01/20/2014 10:51 AM  Signature:  Neill Loftarol Davis RN 01/20/2014 10:51 AM  Signature:  Juline PatchQuylle Yamilet Mcfayden, LCSW 01/20/2014 10:51 AM  Signature:  Chad CordialLauren Carter, LCSW-A 01/20/2014 10:51 AM  Signature:  Leisa LenzValerie Enoch, Care Coordinator [facility] 01/20/2014 10:51 AM  Signature:   01/20/2014 10:51 AM  Signature:  01/20/2014  10:51 AM  Signature:   Onnie BoerJennifer Clark, RN [facility] 01/20/2014  10:51 AM  Signature: 01/20/2014  10:51 AM    Scribe for Treatment Team:   Juline PatchQuylle Kaithlyn Teagle,  01/20/2014 10:51 AM

## 2014-01-20 NOTE — Progress Notes (Signed)
Pt is a 38 year old male admitted with depression and suicidal ideation with a plan to overdose on pills   He has scars on his arm from past self inflicted cuts    He said he has tried numerous times to kill himself and nothing ever works    He said he hasnt been taking his medications on a regular basis because he cant afford them   He said he has been using oxycodone off the streets and has an occasional beer   He last used oxy yesterday and has used it regularly for several weeks   Pt was just here 2 weeks ago but he told this Clinical research associatewriter he had never been here before   Pt is depressed and sad   He endorses suicidal ideation and does contract for safety   Pt was oriented to the unit and given nourishments   He had his medications at [facility] so pt was taken to his room and went to bed   Q 15 min checks were explained and initiated   Pt is safe at present

## 2014-01-20 NOTE — [facility] Group Notes (Signed)
[facility]  01/20/2014 4:11 PM  Type of Therapy:  Group Therapy  Participation Level:  Did Not Attend  Wynn BankerHodnett, Alexander [name] 01/20/2014, 4:11 PM
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
## 2014-01-20 NOTE — [facility] Suicide Risk Assessment (Signed)
   Nursing information obtained from:    Demographic factors:   38 year old man, single, currently unemployed Current Mental Status:   See below  Loss Factors:   economic and social support issues Historical Factors:   Bipolar Disorder by history, chronic pain, alcohol and opiate abuse Risk Reduction Factors:   Sense of responsibility to family Total Time spent with patient: 45 minutes  CLINICAL FACTORS:   Depression:   Anhedonia Impulsivity Substance and Alcohol Abuse  Psychiatric Specialty Exam: Physical Exam  ROS  Blood pressure 125/87, pulse 64, temperature 98 F (36.7 C), temperature source Oral, resp. rate 19, height 5' 8.25" (1.734 m), weight 89.812 kg (198 lb).Body mass index is 29.87 kg/(m^2).  See Admit MSE COGNITIVE FEATURES THAT CONTRIBUTE TO RISK:  Closed-mindedness    SUICIDE RISK:   Moderate:  Frequent suicidal ideation with limited intensity, and duration, some specificity in terms of plans, no associated intent, good self-control, limited dysphoria/symptomatology, some risk factors present, and identifiable protective factors, including available and accessible social support.  PLAN OF CARE:Patient will be admitted to inpatient psychiatric unit for stabilization and safety. Will provide and encourage milieu participation. Provide medication management and maked adjustments as needed.  Will follow daily.  Also provide treatment to minimize opiate and ETOH withdrawal symptoms.   I certify that inpatient services furnished can reasonably be expected to improve the patient's condition.  Willy Pinkerton 01/20/2014, 2:24 PM

## 2014-01-20 NOTE — Progress Notes (Signed)
Patient ID: Alexander MaltaChristopher [name], male   DOB: [birth year], 38 y.o.   MRN: [id number] D Patient reports his sleep was poor and his appetite is poor.  He is rating depression at 10/10, hopelessness at 5/10 and anxiety at 5/10. He is complaining of withdrawal symptoms including aching, cramping, sweating, diarrhea, anxiety and irritability.  He also has back pain.  Patient has tried to attend groups. He is resting between groups.  A- supported patient.  R- Patient saw MD and was started on the clonidine protocol.  He took prn medications for his symptoms and then went to group.

## 2014-01-20 NOTE — Progress Notes (Signed)
Recreation Therapy Notes  Animal-Assisted Activity/Therapy (AAA/T) Program Checklist/Progress Notes Patient Eligibility Criteria Checklist & Daily Group note for Rec Tx Intervention  Date: 07.28.2015 Time: 2:45pm [address]    AAA/T Program Assumption of Risk Form signed by Patient/ or Parent Legal Guardian yes  Patient is free of allergies or sever asthma yes  Patient reports no fear of animals yes  Patient reports no history of cruelty to animals yes   Patient understands his/her participation is voluntary yes  Behavioral Response: Did not attend.   Marykay Lexenise L Araly Kaas, LRT/CTRS  Jearl KlinefelterBlanchfield, Joette Schmoker L 01/20/2014 4:41 PM

## 2014-01-20 NOTE — Progress Notes (Signed)
The focus of this group is to educate the patient on the purpose and policies of crisis stabilization and provide a format to answer questions about their admission.  The group details unit policies and expectations of patients while admitted. Patient attended groupbut did not do exercises or participate in discussion.  He sat quietly and listened

## 2014-01-20 NOTE — Tx Team (Signed)
Initial Interdisciplinary Treatment Plan  PATIENT STRENGTHS: (choose at least two) Average or above average intelligence Capable of independent living General fund of knowledge  PATIENT STRESSORS: Financial difficulties Medication change or noncompliance Substance abuse   PROBLEM LIST: Problem List/Patient Goals Date to be addressed Date deferred Reason deferred Estimated date of resolution  Depression with suicidal ideation      Chemical dependency                                                 DISCHARGE CRITERIA:  Ability to meet basic life and health needs Improved stabilization in mood, thinking, and/or behavior Verbal commitment to aftercare and medication compliance Withdrawal symptoms are absent or subacute and managed without 24-hour nursing intervention  PRELIMINARY DISCHARGE PLAN: Attend aftercare/continuing care group Attend 12-step recovery group Return to previous living arrangement  PATIENT/FAMIILY INVOLVEMENT: This treatment plan has been presented to and reviewed with the patient, Alexander [name], and/or family member, .  The patient and family have been given the opportunity to ask questions and make suggestions.  Alexander [name], Alexander [name] 01/20/2014, 1:27 AM

## 2014-01-21 LAB — BASIC METABOLIC PANEL
ANION GAP: 13 (ref 5–15)
BUN: 7 mg/dL (ref 6–23)
CALCIUM: 9.7 mg/dL (ref 8.4–10.5)
CO2: 24 mEq/L (ref 19–32)
Chloride: 100 mEq/L (ref 96–112)
Creatinine, Ser: 0.64 mg/dL (ref 0.50–1.35)
GFR calc Af Amer: >90 mL/min (ref >90)
Glucose, Bld: 128 mg/dL — ABNORMAL HIGH (ref 70–99)
Potassium: 4 mEq/L (ref 3.7–5.3)
SODIUM: 137 meq/L (ref 137–147)

## 2014-01-21 MED ORDER — TRAZODONE HCL 50 MG PO TABS
50.0000 mg | ORAL_TABLET | Freq: Every evening | ORAL | Status: DC | PRN
  Administered 2014-01-21: 50 mg via ORAL

## 2014-01-21 MED ORDER — DULOXETINE HCL 60 MG PO CPEP
60.0000 mg | ORAL_CAPSULE | Freq: Every day | ORAL | Status: DC
  Administered 2014-01-22 – 2014-01-26 (×5): 60 mg via ORAL
  Filled 2014-01-21 (×6): qty 1

## 2014-01-21 NOTE — Progress Notes (Signed)
D: Patient state she had a good day.  Patient states he would like to get a good nights sleep.  Patient states he is passive SI but verbally contracts for safety.  Patient states he has been anxious and having withdrawal symptoms.  Patient states he did not have goal today.  Patient denies AVH. A: Staff to monitor Q 15 mins for safety.  Encouragement and support offered.  Scheduled medications administered per orders.  Ativan administered prn for anxiety.  Trazodone administered prn for sleep. R: Patient remains safe on the unit.  Patient attended group tonight.  Patient visible on the unit tonight.  Patient taking administered medications.

## 2014-01-21 NOTE — Progress Notes (Signed)
D: Patient denies HI and A/V hallucinations; patient reports thoughts of SI; patient reports sleep is poor; reports appetite is fair; reports energy level is normal ; reports ability to pay attention is normal; rates depression as 10/10; rates hopelessness 10/10; rates anxiety as 10/10; patient reporting symptoms of anxiety, tremors, and complains of back pain  A: Monitored q 15 minutes; patient encouraged to attend groups; patient educated about medications; patient given medications per physician orders; patient encouraged to express feelings and/or concerns; patient given as needed medications to address needs  R: Patient constantly reporting anxiety and back pain; patient is cooperative; patient is flat and blunted;  patient's interaction with staff and peers is appropriate; patient was able to set goal to talk with staff 1:1 when having feelings of SI; patient is taking medications as prescribed and tolerating medications; patient is attending all groups

## 2014-01-21 NOTE — [facility] Group Notes (Signed)
Child/Adolescent Psychoeducational Group Note  Date:  01/21/2014 Time:  9:31 PM  Group Topic/Focus:  Wrap-Up Group:   The focus of this group is to help patients review their daily goal of treatment and discuss progress on daily workbooks.  Participation Level:  Active  Participation Quality:  Appropriate  Affect:  Flat  Cognitive:  Alert, Appropriate and Oriented  Insight:  Improving  Engagement in Group:  Improving  Modes of Intervention:  Discussion and Support  Additional Comments:  Pt stated that his day was "alright" that he has not had any negative thoughts today. One thing the pt stated he likes to do for fun is to bungee jump. Something that makes him happy are his two kids.   Alexander [name], Tuleen Mandelbaum P 01/21/2014, 9:31 PM

## 2014-01-21 NOTE — Progress Notes (Signed)
Adult Psychoeducational Group Note  Date:  01/21/2014 Time:  11:25 AM  Group Topic/Focus:  Personal Choices and Values:   The focus of this group is to help patients assess and explore the importance of values in their lives, how their values affect their decisions, how they express their values and what opposes their expression.  Participation Level:  Active  Participation Quality:  Appropriate, Sharing and Supportive  Affect:  Appropriate  Cognitive:  Alert and Appropriate  Insight: Appropriate  Engagement in Group:  Engaged and Supportive  Modes of Intervention:  Discussion  Additional Comments:    Lauralee Evenerowlin, Nyaira Hodgens Jvette 01/21/2014, 11:25 AM

## 2014-01-21 NOTE — Progress Notes (Signed)
D:  Passive SI-contracts for safety. Pt denies  HI/AV. Pt is pleasant and cooperative. Pt doing ok, minimal contact with Clinical research associatewriter.   A: Pt was offered support and encouragement. Pt was given scheduled medications. Pt was encourage to attend groups. Q 15 minute checks were done for safety.   R:Pt attends groups and interacts well with peers and staff. Pt is taking medication. Pt has no complaints at this time .Pt receptive to treatment and safety maintained on unit.

## 2014-01-21 NOTE — [facility] Group Notes (Signed)
[facility]   01/21/2014 10:05 AM    Participation Quality:  Appropraite  Mood/Affect:  Appropriate  Depression Rating:  8  Anxiety Rating:  5  Thoughts of Suicide:  No  Will you contract for safety?   NA  Current AVH:  No  Plan for Discharge/Comments:  Patient attended discharge planning group and actively participated in group.  He is interested [place] [facility] [place].  CSW provided all participants with daily workbook.   Transportation Means: Patient has transportation.   Supports:  Patient has a support system.   Kaisey Huseby, Joesph JulyQuylle Hairston

## 2014-01-21 NOTE — Progress Notes (Signed)
[facility] MD Progress Note  01/21/2014 2:44 PM Alexander [name]  MRN:  [id number] Subjective:  " About the same"  Objective:  Patient states he feels about the same. Still feels depressed, sad, " down".  At this time is not endorsing significant Withdrawal symptoms- less  Cramps, less aches/pains, and not presenting with any acute restlessness or discomfort. Not tremulous,not agitated and vitals are stable. No medication side effects but feels the Prozac is " not really helping that much". We discussed other options and he agrees to Cymbalta trial, which he has not been on in the past, and which may help address his depression and also his chronic pain issues. Has been going to some groups and behavior on unit in good control. He is interested in going to Residential Rehab treatment after discharge, particularly in the possibility of going to [facility]. Diagnosis:  Bipolar Disorder Depressed, Alcohol Dependence, Opiate Dependence, Opiate WDL, consider Substance Induced Mood Disorder    Total Time spent with patient: 20 minutes    ADL's:  Fair   Sleep: Fair   Appetite:  Fair   Suicidal Ideation:  Denies any suicidal plan or intention at this time and contracts for safety Homicidal Ideation:  Denies  AEB (as evidenced by):  Psychiatric Specialty Exam: Physical Exam  Review of Systems  Constitutional: Negative for fever and chills.  Respiratory: Negative for cough and shortness of breath.   Cardiovascular: Negative for chest pain.  Gastrointestinal: Positive for nausea and abdominal pain.  Skin: Negative for rash.  Neurological: Negative for headaches.  Psychiatric/Behavioral: Positive for depression and substance abuse.    Blood pressure 103/62, pulse 65, temperature 97.9 F (36.6 C), temperature source Oral, resp. rate 18, height 5' 8.25" (1.734 m), weight 89.812 kg (198 lb).Body mass index is 29.87 kg/(m^2).  General Appearance: Fairly Groomed  Engineer, water::  Fair   Speech:  Normal Rate  Volume:  Decreased  Mood:  Depressed  Affect:  Constricted  Thought Process:  Goal Directed and Linear  Orientation:  NA- fully oriented   Thought Content:  Rumination- ruminative about negative aspects of his life, denies hallucinations, no delusions  Suicidal Thoughts:  Denies any suicidal plan or intention at this time and contracts for safety  Homicidal Thoughts: Denies   Memory:  NA  Judgement:  Fair  Insight:  Good  Psychomotor Activity:  Decreased  Concentration:  Good  Recall:  Good  Fund of Knowledge:Good  Language: Good  Akathisia:  Negative  Handed:  Right  AIMS (if indicated):     Assets:  Communication Skills Desire for Improvement Resilience  Sleep:  Number of Hours: 6   Musculoskeletal: Strength & Muscle Tone: within normal limits- no tremors, no diaphoresis Gait & Station: normal Patient leans: N/A  Current Medications: Current Facility-Administered Medications  Medication Dose Route Frequency Provider Last Rate Last Dose  . acetaminophen (TYLENOL) tablet 650 mg  650 mg Oral Q6H PRN Laverle Hobby, PA-C      . alum & mag hydroxide-simeth (MAALOX/MYLANTA) 200-200-20 MG/5ML suspension 30 mL  30 mL Oral Q4H PRN Laverle Hobby, PA-C      . ARIPiprazole (ABILIFY) tablet 10 mg  10 mg Oral Daily Neita Garnet, MD   10 mg at 01/21/14 0758  . cloNIDine (CATAPRES) tablet 0.1 mg  0.1 mg Oral QID Neita Garnet, MD   0.1 mg at 01/21/14 1203   Followed by  . [START ON 01/22/2014] cloNIDine (CATAPRES) tablet 0.1 mg  0.1 mg Oral BH-qamhs  Neita Garnet, MD       Followed by  . [START ON 01/24/2014] cloNIDine (CATAPRES) tablet 0.1 mg  0.1 mg Oral QAC breakfast Neita Garnet, MD      . dicyclomine (BENTYL) tablet 20 mg  20 mg Oral Q6H PRN Neita Garnet, MD   20 mg at 01/21/14 0800  . FLUoxetine (PROZAC) capsule 20 mg  20 mg Oral Daily Laverle Hobby, PA-C   20 mg at 01/21/14 0759  . hydrOXYzine (ATARAX/VISTARIL) tablet 25 mg  25 mg Oral Q6H PRN  Laverle Hobby, PA-C   25 mg at 01/21/14 0322  . ibuprofen (ADVIL,MOTRIN) tablet 600 mg  600 mg Oral Q6H PRN Laverle Hobby, PA-C   600 mg at 01/21/14 0800  . loperamide (IMODIUM) capsule 2-4 mg  2-4 mg Oral PRN Neita Garnet, MD   4 mg at 01/20/14 1322  . LORazepam (ATIVAN) tablet 1 mg  1 mg Oral Q6H PRN Neita Garnet, MD   1 mg at 01/21/14 1257  . magnesium hydroxide (MILK OF MAGNESIA) suspension 30 mL  30 mL Oral Daily PRN Laverle Hobby, PA-C      . methocarbamol (ROBAXIN) tablet 500 mg  500 mg Oral Q8H PRN Neita Garnet, MD   500 mg at 01/21/14 1257  . mirtazapine (REMERON) tablet 45 mg  45 mg Oral QHS Laverle Hobby, PA-C   45 mg at 01/20/14 2112  . nicotine (NICODERM CQ - dosed in mg/24 hours) patch 21 mg  21 mg Transdermal Q0600 Neita Garnet, MD   21 mg at 01/21/14 5732  . ondansetron (ZOFRAN-ODT) disintegrating tablet 4 mg  4 mg Oral Q6H PRN Neita Garnet, MD      . potassium chloride SA (K-DUR,KLOR-CON) CR tablet 20 mEq  20 mEq Oral BID Laverle Hobby, PA-C   20 mEq at 01/21/14 0758  . traZODone (DESYREL) tablet 50 mg  50 mg Oral QHS,MR X 1 Laverle Hobby, PA-C        Lab Results:  Results for orders placed during the hospital encounter of 01/20/14 (from the past 48 hour(s))  BASIC METABOLIC PANEL     Status: Abnormal   Collection Time    01/21/14  6:48 AM      Result Value Ref Range   Sodium 137  137 - 147 mEq/L   Potassium 4.0  3.7 - 5.3 mEq/L   Chloride 100  96 - 112 mEq/L   CO2 24  19 - 32 mEq/L   Glucose, Bld 128 (*) 70 - 99 mg/dL   BUN 7  6 - 23 mg/dL   Creatinine, Ser 0.64  0.50 - 1.35 mg/dL   Calcium 9.7  8.4 - 10.5 mg/dL   GFR calc non Af Amer >90  >90 mL/min   GFR calc Af Amer >90  >90 mL/min   Comment: (NOTE)     The eGFR has been calculated using the CKD EPI equation.     This calculation has not been validated in all clinical situations.     eGFR's persistently <90 mL/min signify possible Chronic Kidney     Disease.   Anion gap 13  5 - 15    Comment: Performed at [facility]    Physical Findings: AIMS: Facial and Oral Movements Muscles of Facial Expression: None, normal Lips and Perioral Area: None, normal Jaw: None, normal Tongue: None, normal,Extremity Movements Upper (arms, wrists, hands, fingers): None, normal Lower (legs, knees, ankles, toes): None, normal, Trunk Movements  Neck, shoulders, hips: None, normal, Overall Severity Severity of abnormal movements (highest score from questions above): None, normal Incapacitation due to abnormal movements: None, normal Patient's awareness of abnormal movements (rate only patient's report): No Awareness, Dental Status Current problems with teeth and/or dentures?: No Does patient usually wear dentures?: No  CIWA:    COWS:  COWS Total Score: 6  Assessment:  Patient remains depressed, constricted, but not suicidal. No ETOH WDL symptoms, but does endorse some ongoing opiate WDL.   And vitals are stable. Tolerating medications well but feels Prozac not helping and wants to try another antidepressant.  Treatment Plan Summary: Daily contact with patient to assess and evaluate symptoms and progress in treatment Medication management See below   Plan: Continue inpatient treatment. D/C Prozac. Start Cymbalta 60 mgrs QAM. Continue Abilify 10 mgrs QHS Continue Remeron 45 mgrs QHS Continue Clonidine detox protocol. As noted, patient wants to go to A Rehab setting after discharge if possible.   Medical Decision Making Problem Points:  Established problem, stable/improving (1), Review of last therapy session (1) and Review of psycho-social stressors (1) Data Points:  Review or order clinical lab tests (1) Review of new medications or change in dosage (2)  I certify that inpatient services furnished can reasonably be expected to improve the patient's condition.   COBOS, [place] 01/21/2014, 2:44 PM

## 2014-01-21 NOTE — [facility] Group Notes (Signed)
[facility] LCSW Group Therapy  Emotional Regulation 1:15 - 2: 30 PM        01/21/2014     Type of Therapy:  Group Therapy  Participation Level:  Patient did not attend group.   Wynn BankerHodnett, Bradon Fester Hairston 01/21/2014

## 2014-01-22 LAB — HEMOGLOBIN A1C
Hgb A1c MFr Bld: 5.6 % (ref <5.7)
Mean Plasma Glucose: 114 mg/dL (ref <117)

## 2014-01-22 MED ORDER — GABAPENTIN 100 MG PO CAPS
200.0000 mg | ORAL_CAPSULE | Freq: Two times a day (BID) | ORAL | Status: DC
  Administered 2014-01-22 – 2014-01-23 (×2): 200 mg via ORAL
  Filled 2014-01-22 (×8): qty 2

## 2014-01-22 MED ORDER — TRAZODONE HCL 100 MG PO TABS
100.0000 mg | ORAL_TABLET | Freq: Every evening | ORAL | Status: DC | PRN
  Administered 2014-01-22 – 2014-01-25 (×4): 100 mg via ORAL
  Filled 2014-01-22: qty 1
  Filled 2014-01-22: qty 14
  Filled 2014-01-22 (×3): qty 1

## 2014-01-22 NOTE — Progress Notes (Signed)
Adult Psychoeducational Group Note  Date:  01/22/2014 Time:  9:53 PM  Group Topic/Focus:  Wrap-Up Group:   The focus of this group is to help patients review their daily goal of treatment and discuss progress on daily workbooks.  Participation Level:  Did Not Attend   Gerrit HeckMcKenzie, Jonh Mcqueary Lee 01/22/2014, 9:53 PM

## 2014-01-22 NOTE — Progress Notes (Signed)
Adult Psychoeducational Group Note  Date:  01/22/2014 Time: 10:00am Group Topic/Focus:  Making Healthy Choices:   The focus of this group is to help patients identify negative/unhealthy choices they were using prior to admission and identify positive/healthier coping strategies to replace them upon discharge.  Participation Level:  Did Not Attend  Participation Quality:    Affect:    Cognitive:   Insight:   Engagement in Group:    Modes of Intervention:    Additional Comments:  Pt did not attend group  Shelly BombardGarner, Eilam [place] D 01/22/2014, 11:07 AM

## 2014-01-22 NOTE — Progress Notes (Signed)
D: Pt denies SI/HI/AVH. Pt is pleasant and cooperative. Pt stated he was doing good. Pt got up to get medication then went back to sleep.  A: Pt was offered support and encouragement. Pt was given scheduled medications. Pt was encourage to attend groups. Q 15 minute checks were done for safety.   R: Pt is taking medication. Pt has no complaints.Pt receptive to treatment and safety maintained on unit.

## 2014-01-22 NOTE — [facility] Group Notes (Signed)
[facility] 01/22/2014 1:15pm  Type of Therapy: Mental Health Association Presentation  Participation Level: Active  Participation Quality: Attentive  Affect: Appropriate  Cognitive: Oriented  Insight: Developing/Improving  Engagement in Therapy: Engaged  Modes of Intervention: Discussion, Education and Socialization  Summary of Progress/Problems: Mental Health Association (MHA) Speaker came to talk about his personal journey with substance abuse and addiction. The pt processed ways by which to relate to the speaker. MHA speaker provided handouts and educational information pertaining to groups and services offered by [facility]. Pt was attentive during group, taking materials given by the guest speaker.  However, Pt appeared anxious AEB rocking back and forth in his chair and restless behaviors.   Chad CordialLauren Carter, LCSWA 01/22/2014 3:00 PM

## 2014-01-22 NOTE — Progress Notes (Signed)
D:  Patient's self inventory sheet, patient has poor sleep, needs sleep medication which did help somewhat last night.  Poor appetite, low energy level, poor concentration.  Rated depression 3, hopeless and anxiety #8.  Denied withdrawals.  Has felt agitation in past 24 hours.  Denied SI.  Has experienced back pain in past 24 hours.  Has taken pain medication which does not really help pain.  Wants to take new medication.  Does have discharge plan. A:  Medications administered per MD orders.  Emotional support and encouragement given patient. R:  Denied SI and HI, contracts for safety.  Denied A/V hallucinations.  Safety maintained with 15 minute checks.

## 2014-01-22 NOTE — Progress Notes (Signed)
The focus of this group is to educate the patient on the purpose and policies of crisis stabilization and provide a format to answer questions about their admission.  The group details unit policies and expectations of patients while admitted.  Patient attended morning nurse education orientation group.  Patient slept through most of the group session.

## 2014-01-22 NOTE — Progress Notes (Signed)
Patient ID: Alexander [name], male   DOB: [birth year], 38 y.o.   MRN: [id number] [facility] MD Progress Note  01/22/2014 4:34 PM Alexander [name]  MRN:  [id number] Subjective:  Patient states he feels better, but reports ongoing anxiety, and some ongoing symptoms of opiate WDL, such as cramps and som diarrhea. Objective:  Patient states he feels about the same. Still feels depressed, sad, " down".  As above, some ongoing Opiate withdrawal symptoms but appears calm, comfortable and not in any acute distress or discomfort. Mood and affect improved, less depressed.   We discussed medication issues- thus far he is tolerating Cymbalta well, denies side effects. As he has chronic pain issues, and also has significant free floating anxiety, he may benefit from Neurontin, which he does not remember taking in the past. We discussed side effects and rationale for treatment and he agreed. Has been going to some groups and behavior on unit in good control. No disruptive behaviors on unit. He continues to express interest in going to a Rehab Setting upon discharge ([facility] As discussed with SW, a bed may be available for him early next week.  Diagnosis:  Bipolar Disorder Depressed, Alcohol Dependence, Opiate Dependence, Opiate WDL, consider Substance Induced Mood Disorder    Total Time spent with patient: 20 minutes    ADL's:  Fair   Sleep: Fair   Appetite:  Fair   Suicidal Ideation:  Denies any suicidal plan or intention at this time and contracts for safety Homicidal Ideation:  Denies  AEB (as evidenced by):  Psychiatric Specialty Exam: Physical Exam  Review of Systems  Constitutional: Negative for fever and chills.  Respiratory: Negative for cough and shortness of breath.   Cardiovascular: Negative for chest pain.  Gastrointestinal: Positive for nausea and abdominal pain.  Skin: Negative for rash.  Neurological: Negative for headaches.  Psychiatric/Behavioral: Positive for  depression and substance abuse.    Blood pressure 120/76, pulse 64, temperature 98.6 F (37 C), temperature source Oral, resp. rate 14, height 5' 8.25" (1.734 m), weight 89.812 kg (198 lb).Body mass index is 29.87 kg/(m^2).  General Appearance: improved grooming   Eye Contact::  Good  Speech:  Normal Rate  Volume:  Decreased  Mood:  Depressed and but less depressed today than upon admission  Affect:  Less constricted   Thought Process:  Goal Directed and Linear  Orientation:  NA- fully oriented   Thought Content: Less ruminative, no psychotic symptoms  Suicidal Thoughts:  Denies any suicidal plan or intention at this time and contracts for safety  Homicidal Thoughts: Denies   Memory:  NA  Judgement:  Fair  Insight:  Good  Psychomotor Activity:  Decreased  Concentration:  Good  Recall:  Good  Fund of Knowledge:Good  Language: Good  Akathisia:  Negative  Handed:  Right  AIMS (if indicated):     Assets:  Communication Skills Desire for Improvement Resilience  Sleep:  Number of Hours: 5.75   Musculoskeletal: Strength & Muscle Tone: within normal limits- no tremors, no diaphoresis Gait & Station: normal Patient leans: N/A  Current Medications: Current Facility-Administered Medications  Medication Dose Route Frequency Provider Last Rate Last Dose  . acetaminophen (TYLENOL) tablet 650 mg  650 mg Oral Q6H PRN Laverle Hobby, PA-C      . alum & mag hydroxide-simeth (MAALOX/MYLANTA) 200-200-20 MG/5ML suspension 30 mL  30 mL Oral Q4H PRN Laverle Hobby, PA-C      . ARIPiprazole (ABILIFY) tablet 10 mg  10 mg Oral  Daily Neita Garnet, MD   10 mg at 01/22/14 8768  . cloNIDine (CATAPRES) tablet 0.1 mg  0.1 mg Oral BH-qamhs Neita Garnet, MD   0.1 mg at 01/22/14 1157   Followed by  . [START ON 01/24/2014] cloNIDine (CATAPRES) tablet 0.1 mg  0.1 mg Oral QAC breakfast Neita Garnet, MD      . dicyclomine (BENTYL) tablet 20 mg  20 mg Oral Q6H PRN Neita Garnet, MD   20 mg at 01/21/14  0800  . DULoxetine (CYMBALTA) DR capsule 60 mg  60 mg Oral Daily Neita Garnet, MD   60 mg at 01/22/14 0807  . gabapentin (NEURONTIN) capsule 200 mg  200 mg Oral BID Neita Garnet, MD      . hydrOXYzine (ATARAX/VISTARIL) tablet 25 mg  25 mg Oral Q6H PRN Laverle Hobby, PA-C   25 mg at 01/21/14 0322  . ibuprofen (ADVIL,MOTRIN) tablet 600 mg  600 mg Oral Q6H PRN Laverle Hobby, PA-C   600 mg at 01/22/14 1441  . loperamide (IMODIUM) capsule 2-4 mg  2-4 mg Oral PRN Neita Garnet, MD   4 mg at 01/20/14 1322  . LORazepam (ATIVAN) tablet 1 mg  1 mg Oral Q6H PRN Neita Garnet, MD   1 mg at 01/22/14 1441  . magnesium hydroxide (MILK OF MAGNESIA) suspension 30 mL  30 mL Oral Daily PRN Laverle Hobby, PA-C      . methocarbamol (ROBAXIN) tablet 500 mg  500 mg Oral Q8H PRN Neita Garnet, MD   500 mg at 01/22/14 0837  . nicotine (NICODERM CQ - dosed in mg/24 hours) patch 21 mg  21 mg Transdermal Q0600 Neita Garnet, MD   21 mg at 01/21/14 2620  . ondansetron (ZOFRAN-ODT) disintegrating tablet 4 mg  4 mg Oral Q6H PRN Neita Garnet, MD      . traZODone (DESYREL) tablet 100 mg  100 mg Oral QHS PRN Neita Garnet, MD        Lab Results:  Results for orders placed during the hospital encounter of 01/20/14 (from the past 48 hour(s))  BASIC METABOLIC PANEL     Status: Abnormal   Collection Time    01/21/14  6:48 AM      Result Value Ref Range   Sodium 137  137 - 147 mEq/L   Potassium 4.0  3.7 - 5.3 mEq/L   Chloride 100  96 - 112 mEq/L   CO2 24  19 - 32 mEq/L   Glucose, Bld 128 (*) 70 - 99 mg/dL   BUN 7  6 - 23 mg/dL   Creatinine, Ser 0.64  0.50 - 1.35 mg/dL   Calcium 9.7  8.4 - 10.5 mg/dL   GFR calc non Af Amer >90  >90 mL/min   GFR calc Af Amer >90  >90 mL/min   Comment: (NOTE)     The eGFR has been calculated using the CKD EPI equation.     This calculation has not been validated in all clinical situations.     eGFR's persistently <90 mL/min signify possible Chronic Kidney     Disease.    Anion gap 13  5 - 15   Comment: Performed at [facility]    Physical Findings: AIMS: Facial and Oral Movements Muscles of Facial Expression: None, normal Lips and Perioral Area: None, normal Jaw: None, normal Tongue: None, normal,Extremity Movements Upper (arms, wrists, hands, fingers): None, normal Lower (legs, knees, ankles, toes): None, normal, Trunk Movements Neck, shoulders, hips: None, normal, Overall  Severity Severity of abnormal movements (highest score from questions above): None, normal Incapacitation due to abnormal movements: None, normal Patient's awareness of abnormal movements (rate only patient's report): No Awareness, Dental Status Current problems with teeth and/or dentures?: No Does patient usually wear dentures?: No  CIWA:  CIWA-Ar Total: 2 COWS:  COWS Total Score: 1  Assessment:  Mood and affect are improving, some symptoms of opiate withdrawal persist, but he appears calm, comfortable. Agrees to Neurontin trial to address chronic pain and anxiety. Does not want to continue Remeron, feels it is not helping him and does not like the way it makes him feel.  Treatment Plan Summary: Daily contact with patient to assess and evaluate symptoms and progress in treatment Medication management See below   Plan: Continue inpatient treatment. Start  Neurontin 200 mgrs BID  Cymbalta 60 mgrs QAM. Abilify 10 mgrs QHS D/C  Remeron 45 mgrs QHS Continue Clonidine detox protocol.    Medical Decision Making Problem Points:  Established problem, stable/improving (1) and Review of last therapy session (1) Data Points:  Review or order clinical lab tests (1) Review of new medications or change in dosage (2)  I certify that inpatient services furnished can reasonably be expected to improve the patient's condition.   , [place] 01/22/2014, 4:34 PM

## 2014-01-23 MED ORDER — GABAPENTIN 100 MG PO CAPS
200.0000 mg | ORAL_CAPSULE | Freq: Three times a day (TID) | ORAL | Status: DC
  Administered 2014-01-23 – 2014-01-26 (×9): 200 mg via ORAL
  Filled 2014-01-23 (×11): qty 2

## 2014-01-23 MED ORDER — LORAZEPAM 1 MG PO TABS
1.0000 mg | ORAL_TABLET | Freq: Three times a day (TID) | ORAL | Status: DC | PRN
  Administered 2014-01-23 – 2014-01-26 (×7): 1 mg via ORAL
  Filled 2014-01-23 (×7): qty 1

## 2014-01-23 NOTE — Progress Notes (Signed)
Adult Psychoeducational Group Note  Date:  01/23/2014 Time:  11:08 PM  Group Topic/Focus:  Wrap-Up Group:   The focus of this group is to help patients review their daily goal of treatment and discuss progress on daily workbooks.  Participation Level:  Active  Participation Quality:  Appropriate  Affect:  Appropriate  Cognitive:  Appropriate  Insight: Appropriate  Engagement in Group:  Engaged  Modes of Intervention:  Discussion  Additional Comments:  Pt was present for wrap up group. His goal today was to go to all of the groups. He accomplished this.   Rosilyn MingsMingia, Sharia [place]itt A 01/23/2014, 11:08 PM

## 2014-01-23 NOTE — Progress Notes (Signed)
Patient ID: Alexander [name], male   DOB: 31-Jan-1976, 38 y.o.   MRN: [id number] [facility] MD Progress Note  01/23/2014 2:51 PM Krist Rosenboom  MRN:  [id number] Subjective:  Reports some persisting diarrhea, as a residual WDL symptom, but otherwise improved, cramping/aches improved. Describes significant anxiety.  Objective:  I have discussed case with treatment team and have met with patient. Patient reports ongoing anxiety, which he states to some degree is related to being " in a closed setting where  I can't go bicycling or doing something outdoors to relieve stress".  As above, some diarrhea as residual opiate withdrawal, but other symptoms have subsided. He does not appear to be in any acute distress or discomfort. At this time he is tolerating medications well and denies side effects. Behavior on unit in good control, no disruptive behaviors . He is agreeing to go to [facility] , and as discussed with SW, he has a bed available early next week.  Hgb A1C  5.6   Diagnosis:  Bipolar Disorder Depressed, Alcohol Dependence, Opiate Dependence, Opiate WDL, consider Substance Induced Mood Disorder    Total Time spent with patient: 20 minutes    ADL's:  Fair   Sleep: Fair   Appetite:  Fair   Suicidal Ideation:  Denies any suicidal plan or intention at this time and contracts for safety Homicidal Ideation:  Denies  AEB (as evidenced by):  Psychiatric Specialty Exam: Physical Exam  Review of Systems  Constitutional: Negative for fever and chills.  Respiratory: Negative for cough and shortness of breath.   Cardiovascular: Negative for chest pain.  Gastrointestinal: Positive for nausea and abdominal pain.  Skin: Negative for rash.  Neurological: Negative for headaches.  Psychiatric/Behavioral: Positive for depression and substance abuse.    Blood pressure 115/78, pulse 86, temperature 97.9 F (36.6 C), temperature source Oral, resp. rate 16, height 5' 8.25" (1.734 m),  weight 89.812 kg (198 lb).Body mass index is 29.87 kg/(m^2).  General Appearance: improved grooming   Eye Contact::  Good  Speech:  Normal Rate  Volume:  Decreased  Mood:  Less depressed   Affect:  Less constricted , affect more reactive, remains anxious  Thought Process:  Goal Directed and Linear  Orientation:  NA- fully oriented   Thought Content: Less ruminative, no psychotic symptoms  Suicidal Thoughts:  Denies any suicidal plan or intention at this time and contracts for safety  Homicidal Thoughts: Denies   Memory:  NA  Judgement:  Fair  Insight:  Good  Psychomotor Activity:  Normal  Concentration:  Good  Recall:  Good  Fund of Knowledge:Good  Language: Good  Akathisia:  Negative  Handed:  Right  AIMS (if indicated):     Assets:  Communication Skills Desire for Improvement Resilience  Sleep:  Number of Hours: 4.5   Musculoskeletal: Strength & Muscle Tone: within normal limits- no tremors, no diaphoresis Gait & Station: normal Patient leans: N/A  Current Medications: Current Facility-Administered Medications  Medication Dose Route Frequency Provider Last Rate Last Dose  . acetaminophen (TYLENOL) tablet 650 mg  650 mg Oral Q6H PRN Laverle Hobby, PA-C      . alum & mag hydroxide-simeth (MAALOX/MYLANTA) 200-200-20 MG/5ML suspension 30 mL  30 mL Oral Q4H PRN Laverle Hobby, PA-C      . ARIPiprazole (ABILIFY) tablet 10 mg  10 mg Oral Daily Neita Garnet, MD   10 mg at 01/23/14 0749  . cloNIDine (CATAPRES) tablet 0.1 mg  0.1 mg Oral BH-qamhs Fernando Cobos,  MD   0.1 mg at 01/23/14 0749   Followed by  . [START ON 01/24/2014] cloNIDine (CATAPRES) tablet 0.1 mg  0.1 mg Oral QAC breakfast Neita Garnet, MD      . dicyclomine (BENTYL) tablet 20 mg  20 mg Oral Q6H PRN Neita Garnet, MD   20 mg at 01/21/14 0800  . DULoxetine (CYMBALTA) DR capsule 60 mg  60 mg Oral Daily Neita Garnet, MD   60 mg at 01/23/14 0749  . gabapentin (NEURONTIN) capsule 200 mg  200 mg Oral TID  Neita Garnet, MD      . hydrOXYzine (ATARAX/VISTARIL) tablet 25 mg  25 mg Oral Q6H PRN Laverle Hobby, PA-C   25 mg at 01/23/14 1117  . ibuprofen (ADVIL,MOTRIN) tablet 600 mg  600 mg Oral Q6H PRN Laverle Hobby, PA-C   600 mg at 01/23/14 0500  . loperamide (IMODIUM) capsule 2-4 mg  2-4 mg Oral PRN Neita Garnet, MD   4 mg at 01/20/14 1322  . LORazepam (ATIVAN) tablet 1 mg  1 mg Oral Q8H PRN Neita Garnet, MD      . magnesium hydroxide (MILK OF MAGNESIA) suspension 30 mL  30 mL Oral Daily PRN Laverle Hobby, PA-C      . methocarbamol (ROBAXIN) tablet 500 mg  500 mg Oral Q8H PRN Neita Garnet, MD   500 mg at 01/23/14 0500  . nicotine (NICODERM CQ - dosed in mg/24 hours) patch 21 mg  21 mg Transdermal Q0600 Neita Garnet, MD   21 mg at 01/23/14 0749  . ondansetron (ZOFRAN-ODT) disintegrating tablet 4 mg  4 mg Oral Q6H PRN Neita Garnet, MD      . traZODone (DESYREL) tablet 100 mg  100 mg Oral QHS PRN Neita Garnet, MD   100 mg at 01/22/14 2122    Lab Results:  Results for orders placed during the hospital encounter of 01/20/14 (from the past 48 hour(s))  HEMOGLOBIN A1C     Status: None   Collection Time    01/22/14  6:18 AM      Result Value Ref Range   Hemoglobin A1C 5.6  <5.7 %   Comment: (NOTE)                                                                               According to the ADA Clinical Practice Recommendations for 2011, when     HbA1c is used as a screening test:      >=6.5%   Diagnostic of Diabetes Mellitus               (if abnormal result is confirmed)     5.7-6.4%   Increased risk of developing Diabetes Mellitus     References:Diagnosis and Classification of Diabetes Mellitus,Diabetes     DJME,2683,41(DQQIW 1):S62-S69 and Standards of Medical Care in             Diabetes - 2011,Diabetes Care,2011,34 (Suppl 1):S11-S61.   Mean Plasma Glucose 114  <117 mg/dL   Comment: Performed at [facility]    Physical Findings: AIMS: Facial and Oral  Movements Muscles of Facial Expression: None, normal Lips and Perioral Area: None, normal Jaw: None, normal Tongue: None,  normal,Extremity Movements Upper (arms, wrists, hands, fingers): None, normal Lower (legs, knees, ankles, toes): None, normal, Trunk Movements Neck, shoulders, hips: None, normal, Overall Severity Severity of abnormal movements (highest score from questions above): None, normal Incapacitation due to abnormal movements: None, normal Patient's awareness of abnormal movements (rate only patient's report): No Awareness, Dental Status Current problems with teeth and/or dentures?: No Does patient usually wear dentures?: No  CIWA:  CIWA-Ar Total: 2 COWS:  COWS Total Score: 2  Assessment:  Mood is better, he is less depressed, but he does remain anxious, and subjectively restless.  Some ongoing WDL symptoms, but improved ( primarily some ongoing lose stools) He is tolerating medications well, to include Neurontin and Cymbalta, recently started to address depression, anxiety, and pain. He is planning on going to [facility] after discharge.   Treatment Plan Summary: Daily contact with patient to assess and evaluate symptoms and progress in treatment Medication management See below   Plan: Continue inpatient treatment. Increase  Neurontin 200 mgrs TID  Cymbalta 60 mgrs QAM. Abilify 10 mgrs QHS Due to severe anxiety , continue Ativan PRNs for anxiety, but he understands that the Ativan will be D/Cd prior to discharge. Completing Clonidine detox protocol.    Medical Decision Making Problem Points:  Established problem, stable/improving (1) and Review of last therapy session (1) Data Points:  Review or order clinical lab tests (1) Review of new medications or change in dosage (2)  I certify that inpatient services furnished can reasonably be expected to improve the patient's condition.   COBOS, FERNANDO 01/23/2014, 2:51 PM

## 2014-01-23 NOTE — Progress Notes (Signed)
Adult Psychoeducational Group Note  Date:  01/23/2014 Time:  11:36 AM  Group Topic/Focus:  Relapse Prevention Planning:   The focus of this group is to define relapse and discuss the need for planning to combat relapse.  Participation Level:  Active  Participation Quality:  Appropriate  Affect:  Appropriate  Cognitive:  Alert  Insight: Appropriate  Engagement in Group:  Engaged  Modes of Intervention:  Discussion  Additional Comments:  Purpose of group was to discuss relapse prevention. Pt stated that continuing medication and listing 20 coping skills during the early signs of relapse has been helpful.  Danyal Adorno, Alfredia ClientAndreia 01/23/2014, 11:36 AM

## 2014-01-23 NOTE — [facility] Group Notes (Signed)
[facility] LCSW Group Therapy  Feelings Around Relapse 1:15 -2:30        01/23/2014   Type of Therapy:  Group Therapy  Participation Level:  Appropriate  Participation Quality:  Appropriate  Affect:  Appropriate  Cognitive:  Attentive Appropriate  Insight:  Developing/Improving  Engagement in Therapy: Developing/Improving  Modes of Intervention:  Discussion Exploration Problem-Solving Supportive  Summary of Progress/Problems:  The topic for today was feelings around relapse.    Patient processed feelings toward relapse and was able to relate to peers. He advised relapsing would be drinking alcohol and not taking his medications.  Patient identified coping skills that can be used to prevent a relapse.   Wynn BankerHodnett, Alexander [name] 01/23/2014

## 2014-01-23 NOTE — Progress Notes (Signed)
D: Patient denies SI/HI and A/V hallucinations; patient reports sleep is fair; reports appetite is fair; reports energy level is normal ; reports concentration is good; rates depression as 3/10; rates hopelessness 3/10; rates anxiety as 5/10; patient complaining of anxiety after group that brought up some memories from his childhood and also about drugs and relapsing  A: Monitored q 15 minutes; patient encouraged to attend groups; patient educated about medications; patient given medications per physician orders; patient encouraged to express feelings and/or concerns  R: Patient is cooperative but can be anxious at times; patient engages with peers and staff except during moments of anxiety; patient's interaction with staff and peers is appropriate; patient was able to set goal to talk with staff 1:1 when having feelings of SI; patient is taking medications as prescribed and tolerating medications; patient is attending all groups

## 2014-01-23 NOTE — [facility] Group Notes (Signed)
[facility] Note   01/23/2014 10:31 AM    Participation Quality:  Appropraite  Mood/Affect:  Appropriate  Depression Rating:  2  Anxiety Rating:  5  Thoughts of Suicide:  No  Will you contract for safety?   NA  Current AVH:  No  Plan for Discharge/Comments:  Patient attended discharge planning group and actively participated in group. He will follow up with [facility] for residential treatment.  CSW provided all participants with daily workbook.   Transportation Means: Patient has transportation.   Supports:  Patient has a support system.   Alexander [name], Alexander [name]

## 2014-01-23 NOTE — Tx Team (Signed)
Interdisciplinary Treatment Plan Update   Date Reviewed:  01/23/2014  Time Reviewed:  10:24 AM  Progress in Treatment:   Attending groups: Yes Participating in groups: Yes Taking medication as prescribed: Yes  Tolerating medication: Yes Family/Significant other contact made:  No, patient declined collateral contact Patient understands diagnosis: Yes  Discussing patient identified problems/goals with staff: Yes Medical problems stabilized or resolved: Yes Denies suicidal/homicidal ideation: Yes Patient has not harmed self or others: Yes  For review of initial/current patient goals, please see plan of care.  Estimated Length of Stay:  3-5 days  Reasons for Continued Hospitalization:  Anxiety Depression Medication stabilization   New Problems/Goals identified:    Discharge Plan or Barriers: Patient to discharge to [facility] [place]  Additional Comments:  Attendees:  Patient:  01/23/2014 10:24 AM   Signature:  Sallyanne HaversF. Cobos, MD 01/23/2014 10:24 AM  Signature:  01/23/2014 10:24 AM  Signature:   01/23/2014 10:24 AM  Signature:  Leighton ParodyBritney Tyson, RN 01/23/2014 10:24 AM  Signature:  Harold Barbanonecia Byrd,  RN 01/23/2014 10:24 AM  Signature:  Juline PatchQuylle Luchiano Viscomi, LCSW 01/23/2014 10:24 AM  Signature:  01/23/2014 10:24 AM  Signature:   01/23/2014 10:24 AM  Signature:   01/23/2014 10:24 AM  Signature:  01/23/2014  10:24 AM  Signature:   Onnie BoerJennifer Clark, RN URCM 01/23/2014  10:24 AM  Signature: 01/23/2014  10:24 AM    Scribe for Treatment Team:   Juline PatchQuylle Nikolus Marczak,  01/23/2014 10:24 AM

## 2014-01-23 NOTE — Progress Notes (Signed)
Assumed care of patient at 2300. Pt observed resting in bed with eyes closed. RR WNL, even and unlabored. No acute distress. Level III obs in place for safety and pt is safe. Alexander [name], [name]

## 2014-01-23 NOTE — Progress Notes (Signed)
D: Pt denies SI/HI/AVH. Pt is pleasant and cooperative. Pt stated the anxiety has increased, but the pain in his back has decreased.   A: Pt was offered support and encouragement. Pt was given scheduled medications. Pt was encourage to attend groups. Q 15 minute checks were done for safety.   R:Pt attends groups and interacts well with peers and staff. Pt is taking medication. Pt has no complaints at this time .Pt receptive to treatment and safety maintained on unit.

## 2014-01-24 DIAGNOSIS — F19939 Other psychoactive substance use, unspecified with withdrawal, unspecified: Secondary | ICD-10-CM

## 2014-01-24 DIAGNOSIS — F3130 Bipolar disorder, current episode depressed, mild or moderate severity, unspecified: Secondary | ICD-10-CM

## 2014-01-24 DIAGNOSIS — F1994 Other psychoactive substance use, unspecified with psychoactive substance-induced mood disorder: Secondary | ICD-10-CM

## 2014-01-24 DIAGNOSIS — F1020 Alcohol dependence, uncomplicated: Principal | ICD-10-CM

## 2014-01-24 DIAGNOSIS — F1120 Opioid dependence, uncomplicated: Secondary | ICD-10-CM

## 2014-01-24 MED ORDER — BENZTROPINE MESYLATE 1 MG PO TABS
ORAL_TABLET | ORAL | Status: AC
  Administered 2014-01-24: 1 mg via ORAL
  Filled 2014-01-24: qty 1

## 2014-01-24 MED ORDER — BENZTROPINE MESYLATE 1 MG PO TABS
1.0000 mg | ORAL_TABLET | Freq: Two times a day (BID) | ORAL | Status: DC
  Administered 2014-01-24 – 2014-01-26 (×4): 1 mg via ORAL
  Filled 2014-01-24 (×6): qty 1

## 2014-01-24 NOTE — Progress Notes (Signed)
**Note Alexander-Identified via Obfuscation** D Alexander [name] has free floating anxiety that he struggles to deal with all of the time. HE attends his groups and he is engaged with his recovery as evidenced by  His attendance in his groups, his compliance with his medications .   A HE completes his morning assessment and writes on it he denies SI, he rates his depression, anxiety and hopelessness "0/0/5" and says his DC plan encompassess " just do it " as a motto for living!Marland Kitchen.   R Safety is in place and poc cont.

## 2014-01-24 NOTE — Plan of Care (Signed)
Problem: Ineffective individual coping Goal: STG: Patient will remain free from self harm Outcome: Progressing Patient has not engaged in any self harm while on unit. While he endorsed passive SI earlier in the week, he now denies.   Problem: Diagnosis: Increased Risk For Suicide Attempt Goal: STG-Patient Will Comply With Medication Regime Outcome: Progressing Patient has been med compliant and has been active in his treatment.

## 2014-01-24 NOTE — [facility] Group Notes (Signed)
[facility] Group Notes:  (Clinical Social Work)  01/24/2014   1:15-2:15PM  Summary of Progress/Problems:   The main focus of today's process group was for the patient to identify ways in which they have sabotaged their own mental health wellness/recovery.  Motivational interviewing and a handout were used to explore the benefits and costs of their self-sabotaging behavior as well as the benefits and costs of changing this behavior.  The Stages of Change were explained to the group using a handout, and patients identified where they are with regard to changing self-defeating behaviors.  The patient expressed he self-sabotages with negative self-talk, isolation and being a people pleaser, among others.  He was insightful and supportive of other people, describing the process of getting to where he is now from someplace even much worse.  Type of Therapy:  Process Group  Participation Level:  Active  Participation Quality:  Attentive, Sharing and Supportive  Affect:  Blunted  Cognitive:  Appropriate and Oriented  Insight:  Engaged  Engagement in Therapy:  Engaged  Modes of Intervention:  Education, Motivational Interviewing   Pilgrim's PrideMareida Grossman-Orr, LCSW 01/24/2014, 4:00pm

## 2014-01-24 NOTE — Progress Notes (Signed)
.  Psychoeducational Group Note    Date: 01/24/2014 Time: 0930  Goal Setting Purpose of Group: To be able to set a goal that is measurable and that can be accomplished in one day Participation Level:  Active  Participation Quality:  Appropriate  Affect:  Appropriate  Cognitive:  Oriented  Insight:  Improving  Engagement in Group:  Engaged  Additional Comments:  Pt participated and was responsive in the group. Was able to set a goal.  Sarin Comunale A  

## 2014-01-24 NOTE — Progress Notes (Signed)
Adult Psychoeducational Group Note  Date:  01/24/2014 Time:  11:06 PM  Group Topic/Focus:  Wrap-Up Group:   The focus of this group is to help patients review their daily goal of treatment and discuss progress on daily workbooks.  Participation Level:  Active  Participation Quality:  Appropriate  Affect:  Appropriate  Cognitive:  Appropriate  Insight: Appropriate  Engagement in Group:  Engaged  Modes of Intervention:  Discussion  Additional Comments:  Pt was present for wrap up group. He reported that he was upset this evening because he did not have [name] doctor's order to go to AA. He shared that he has [name] dual diagnosis and feels AA would benefit him. He said that he was angry enough to punch [name] wall, but will not do that. This Clinical research associatewriter encouraged him to share with the group how he is able to overcome that anger. He said that he took [name] time out to think about the consequences of his potential behavior and that allowed him to chose [name] more appropriate response. The patient shared yesterday that he learned that relapse does not just involve drugs and alcohol. This Clinical research associatewriter reminded him of that realization and asked him how he is taking [name] stressful situation such as not being able to attend AA and turning it into [name] manageable situation. He shared that his medication is helping. His goal was to attend all the groups today- and he did that. Pt was pleasant and cooperative all evening.   Rosilyn MingsMingia, Alexander [name] 01/24/2014, 11:06 PM

## 2014-01-24 NOTE — Progress Notes (Signed)
Patient ID: Alexander [name], male   DOB: 06/28/1975, 38 y.o.   MRN: [id number] [facility] MD Progress Note  01/24/2014 5:44 PM Alexander [name]  MRN:  [id number] Subjective:  Pt seen and chart reviewed. Pt denies SI, HI, and AVH, contracts for safety. Pt reports that he is going to [facility] in [place] for residential treatment where he has been before. Pt reports that he was told by Dr. Jama Flavors that he would be going there on Monday and that social work also confirmed this. Pt is calm, cooperative, answering questions appropriately, and is in agreement with medication and treatment regimen.   Diagnosis:  Bipolar Disorder Depressed, Alcohol Dependence, Opiate Dependence, Opiate WDL, consider Substance Induced Mood Disorder    Total Time spent with patient: 25 minutes    ADL's:  Fair   Sleep: Fair   Appetite:  Fair   Suicidal Ideation:  Denies any suicidal plan or intention at this time and contracts for safety Homicidal Ideation:  Denies  AEB (as evidenced by):  Psychiatric Specialty Exam: Physical Exam  Review of Systems  Constitutional: Negative for fever and chills.  Respiratory: Negative for cough and shortness of breath.   Cardiovascular: Negative for chest pain.  Gastrointestinal: Positive for nausea and abdominal pain.  Skin: Negative for rash.  Neurological: Negative for headaches.  Psychiatric/Behavioral: Positive for depression and substance abuse.    Blood pressure 98/70, pulse 71, temperature 97.4 F (36.3 C), temperature source Oral, resp. rate 18, height 5' 8.25" (1.734 m), weight 89.812 kg (198 lb).Body mass index is 29.87 kg/(m^2).  General Appearance: improved grooming   Eye Contact::  Good  Speech:  Normal Rate  Volume:  Decreased  Mood:  Less depressed   Affect:  Less constricted , affect more reactive, remains anxious  Thought Process:  Goal Directed and Linear  Orientation:  NA- fully oriented   Thought Content: Less ruminative, no psychotic  symptoms  Suicidal Thoughts:  No  Homicidal Thoughts: Denies   Memory:  NA  Judgement:  Fair  Insight:  Good  Psychomotor Activity:  Normal  Concentration:  Good  Recall:  Good  Fund of Knowledge:Good  Language: Good  Akathisia:  Negative  Handed:  Right  AIMS (if indicated):     Assets:  Communication Skills Desire for Improvement Resilience  Sleep:  Number of Hours: 6   Musculoskeletal: Strength & Muscle Tone: within normal limits- no tremors, no diaphoresis Gait & Station: normal Patient leans: N/A  Current Medications: Current Facility-Administered Medications  Medication Dose Route Frequency Provider Last Rate Last Dose  . acetaminophen (TYLENOL) tablet 650 mg  650 mg Oral Q6H PRN Kerry Hough, PA-C      . alum & mag hydroxide-simeth (MAALOX/MYLANTA) 200-200-20 MG/5ML suspension 30 mL  30 mL Oral Q4H PRN Kerry Hough, PA-C      . ARIPiprazole (ABILIFY) tablet 10 mg  10 mg Oral Daily Nehemiah Massed, MD   10 mg at 01/24/14 7829  . cloNIDine (CATAPRES) tablet 0.1 mg  0.1 mg Oral QAC breakfast Nehemiah Massed, MD   0.1 mg at 01/24/14 5621  . dicyclomine (BENTYL) tablet 20 mg  20 mg Oral Q6H PRN Nehemiah Massed, MD   20 mg at 01/21/14 0800  . DULoxetine (CYMBALTA) DR capsule 60 mg  60 mg Oral Daily Nehemiah Massed, MD   60 mg at 01/24/14 3086  . gabapentin (NEURONTIN) capsule 200 mg  200 mg Oral TID Nehemiah Massed, MD   200 mg at 01/24/14 1717  .  hydrOXYzine (ATARAX/VISTARIL) tablet 25 mg  25 mg Oral Q6H PRN Kerry HoughSpencer E Simon, PA-C   25 mg at 01/24/14 1508  . ibuprofen (ADVIL,MOTRIN) tablet 600 mg  600 mg Oral Q6H PRN Kerry HoughSpencer E Simon, PA-C   600 mg at 01/23/14 0500  . loperamide (IMODIUM) capsule 2-4 mg  2-4 mg Oral PRN Nehemiah MassedFernando Cobos, MD   4 mg at 01/20/14 1322  . LORazepam (ATIVAN) tablet 1 mg  1 mg Oral Q8H PRN Nehemiah MassedFernando Cobos, MD   1 mg at 01/24/14 1718  . magnesium hydroxide (MILK OF MAGNESIA) suspension 30 mL  30 mL Oral Daily PRN Kerry HoughSpencer E Simon, PA-C      .  methocarbamol (ROBAXIN) tablet 500 mg  500 mg Oral Q8H PRN Nehemiah MassedFernando Cobos, MD   500 mg at 01/23/14 0500  . nicotine (NICODERM CQ - dosed in mg/24 hours) patch 21 mg  21 mg Transdermal Q0600 Nehemiah MassedFernando Cobos, MD   21 mg at 01/24/14 0815  . ondansetron (ZOFRAN-ODT) disintegrating tablet 4 mg  4 mg Oral Q6H PRN Nehemiah MassedFernando Cobos, MD      . traZODone (DESYREL) tablet 100 mg  100 mg Oral QHS PRN Nehemiah MassedFernando Cobos, MD   100 mg at 01/24/14 0045    Lab Results:  No results found for this or any previous visit (from the past 48 hour(s)).  Physical Findings: AIMS: Facial and Oral Movements Muscles of Facial Expression: None, normal Lips and Perioral Area: None, normal Jaw: None, normal Tongue: None, normal,Extremity Movements Upper (arms, wrists, hands, fingers): None, normal Lower (legs, knees, ankles, toes): None, normal, Trunk Movements Neck, shoulders, hips: None, normal, Overall Severity Severity of abnormal movements (highest score from questions above): None, normal Incapacitation due to abnormal movements: None, normal Patient's awareness of abnormal movements (rate only patient's report): No Awareness, Dental Status Current problems with teeth and/or dentures?: No Does patient usually wear dentures?: No  CIWA:  CIWA-Ar Total: 2 COWS:  COWS Total Score: 0  Assessment:  Mood is better, he is less depressed, but he does remain anxious, and subjectively restless.  Some ongoing WDL symptoms, but improved ( primarily some ongoing lose stools) He is tolerating medications well, to include Neurontin and Cymbalta, recently started to address depression, anxiety, and pain. [facility].   Treatment Plan Summary: Daily contact with patient to assess and evaluate symptoms and progress in treatment Medication management See below   Plan: Continue inpatient treatment. Neurontin 200 mgrs TID  Cymbalta 60 mgrs QAM. Abilify 10 mgrs QHS Due to severe anxiety ,  continue Ativan PRNs for anxiety, but he understands that the Ativan will be D/Cd prior to discharge. Completing Clonidine detox protocol.    Medical Decision Making Problem Points:  Established problem, stable/improving (1) and Review of last therapy session (1) Data Points:  Review or order clinical lab tests (1) Review of new medications or change in dosage (2)  I certify that inpatient services furnished can reasonably be expected to improve the patient's condition.   Alexander [name], John C, FNP-BC 01/24/2014, 5:44 PM  Patient seen, evaluated and I agree with notes by Nurse Practitioner. Thedore MinsMojeed Shanikka Wonders, MD

## 2014-01-24 NOTE — Progress Notes (Signed)
Spoke with patient 1:1 who is complaining of anxiety and is requesting prn vistaril. He does indicate however that he is improving overall. Indicates the vistaril will also help him sleep. States his back pain remains at an 8/10 and reports it rarely gets lower than a 5. Offered available prns for pain but he declined. Medicated with vistaril and reassess at the 1 hour mark. Given support and encouragement. He denies SI/HI/AVH and hopes for discharge early next week. Lawrence MarseillesFriedman, Tryton Bodi Eakes

## 2014-01-24 NOTE — Progress Notes (Signed)
Psychoeducational Group Note  Date: 01/24/2014 Time:  1015  Group Topic/Focus:  Identifying Needs:   The focus of this group is to help patients identify their personal needs that have been historically problematic and identify healthy behaviors to address their needs.  Participation Level:  Active  Participation Quality:  Attentive  Affect:  Appropriate  Cognitive:  Oriented  Insight:  Improving  Engagement in Group:  Engaged  Additional Comments:  Attended the group and partisapated  Alexander [name] A 

## 2014-01-25 MED ORDER — GATORADE (BH)
240.0000 mL | Freq: Three times a day (TID) | ORAL | Status: DC | PRN
  Filled 2014-01-25: qty 480

## 2014-01-25 NOTE — Progress Notes (Signed)
Psychoeducational Group Note  Date: 01/25/2014 Time:  0930  Group Topic/Focus:  Gratefulness:  The focus of this group is to help patients identify what two things they are most grateful for in their lives. What helps ground them and to center them on their work to their recovery.  Participation Level:  Active  Participation Quality:  Appropriate  Affect:  Appropriate  Cognitive:  Oriented  Insight:  Improving  Engagement in Group:  Not Engaged  Additional Comments:  Pt did not participated in the group. Came in and fell asleep  Vira BlancoJudge, Kaeleb Emond A

## 2014-01-25 NOTE — Progress Notes (Signed)
D Alexander [name] is out in the milieu today..he interacts appropriately and is engaged in his recovery as evidenced by his related questions, his comments in groups and his behavior. HE says " my anxiety is not quite as bad today. I actually slept last night".   A HE takes all of his meds as scheduled. HE completes his AM assessment  and on this he writes he denies experiencing SI within the past 24 hrs. He rates his depression, hopelessness and anxiety as " 0-1/0-1/8" and he says his DC plan is to " take anxiety medicines and to stay focused".   R Safety is in place and poc cont.

## 2014-01-25 NOTE — Progress Notes (Signed)
Spoke with patient 1:1 who reports having a very good day. "This has been the best one yet." Feels comfortable discharging to [facility] in the morning. Patient still asks and receives prn meds for his anxiety which are effective. Given trazadone and vistaril prn at hs and was asleep on reassess. Pain remains an 8/10 but patient states the addition of neurontin has helped, especially in the mornings. He denies SI/HI/Avh and is safe. Lawrence MarseillesFriedman, Shantel Helwig Eakes

## 2014-01-25 NOTE — Plan of Care (Signed)
Problem: Ineffective individual coping Goal: LTG: Patient will report a decrease in negative feelings Outcome: Completed/Met Date Met:  01/25/14 Patient states he feels his mood has stabilized and reports this is the best day he's had in quite awhile. Looks forward to discharge.   Problem: Diagnosis: Increased Risk For Suicide Attempt Goal: LTG-Patient Will Show Positive Response to Medication LTG (by discharge) : Patient will show positive response to medication and will participate in the development of the discharge plan.  Outcome: Completed/Met Date Met:  01/25/14 Patient's med regimen has been effective in that patient's mood is now stable. SI no longer present and patient expresses hope going forward.      

## 2014-01-25 NOTE — Progress Notes (Signed)
Psychoeducational Group Note  Date:  01/25/2014 Time:  1015  Group Topic/Focus:  Making Healthy Choices:   The focus of this group is to help patients identify negative/unhealthy choices they were using prior to admission and identify positive/healthier coping strategies to replace them upon discharge.  Participation Level:  Active  Participation Quality:  Appropriate  Affect:  Flat  Cognitive:  Oriented  Insight:  Improving  Engagement in Group:  Engaged  Additional Comments:  Pt participated and was engaged with the discussion  Selah Zelman A 01/25/2014  

## 2014-01-25 NOTE — [facility] Group Notes (Signed)
Adult Psychoeducational Group Note  Date:  01/25/2014 Time:  4:55 PM  Group Topic/Focus:  Self Care:   The focus of this group is to help patients understand the importance of self-care in order to improve or restore emotional, physical, spiritual, interpersonal, and financial health.  Participation Level:  Active  Participation Quality:  Appropriate  Affect:  Appropriate  Cognitive:  Alert  Insight: Appropriate  Engagement in Group:  Engaged  Modes of Intervention:  Discussion   Additional Comments: Pt attended group. Pts stated he enjoyed hockey, football, watching movies and going fishing. Pt stated his support group consisted of his higher power, his son and his 3 good friends.   Leonides CaveHolcomb, Jabes Primo G 01/25/2014, 4:55 PM

## 2014-01-25 NOTE — Progress Notes (Signed)
Patient ID: Alexander [name], male   DOB: 1976/04/11, 38 y.o.   MRN: [id number] [facility] MD Progress Note  01/25/2014 1:02 PM Alexander [name]  MRN:  [id number] Subjective:  Pt seen and chart reviewed. Pt denies SI, HI, and AVH, contracts for safety. Pt is continuing the plan to discharge to going to [facility] in [place] for residential treatment where he has been before. Pt reports that he was told by Dr. Jama Flavors that he would be going there on Monday and that social work also confirmed this. Pt is calm, cooperative, answering questions appropriately, and is in agreement with medication and treatment regimen. Pt reports that he feels excellent and that he feels "the best possible" today.   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  Respiratory: Negative for cough and shortness of breath.   Cardiovascular: Negative for chest pain.  Gastrointestinal: Positive for nausea and abdominal pain.  Skin: Negative for rash.  Neurological: Negative for headaches.  Psychiatric/Behavioral: Positive for depression and substance abuse.    Blood pressure 120/80, pulse 89, temperature 97.7 F (36.5 C), temperature source Oral, resp. rate 18, height 5' 8.25" (1.734 m), weight 89.812 kg (198 lb).Body mass index is 29.87 kg/(m^2).  General Appearance: improved grooming   Eye Contact::  Good  Speech:  Normal Rate  Volume:  Decreased  Mood:  Less depressed   Affect:  Less constricted , affect more reactive, remains anxious  Thought Process:  Goal  Directed and Linear  Orientation:  NA- fully oriented   Thought Content: Less ruminative, no psychotic symptoms  Suicidal Thoughts:  No  Homicidal Thoughts: Denies   Memory:  NA  Judgement:  Fair  Insight:  Good  Psychomotor Activity:  Normal  Concentration:  Good  Recall:  Good  Fund of Knowledge:Good  Language: Good  Akathisia:  Negative  Handed:  Right  AIMS (if indicated):     Assets:  Communication Skills Desire for Improvement Resilience  Sleep:  Number of Hours: 6.5   Musculoskeletal: Strength & Muscle Tone: within normal limits- no tremors, no diaphoresis Gait & Station: normal Patient leans: N/A  Current Medications: Current Facility-Administered Medications  Medication Dose Route Frequency Provider Last Rate Last Dose  . acetaminophen (TYLENOL) tablet 650 mg  650 mg Oral Q6H PRN Kerry Hough, PA-C      . alum & mag hydroxide-simeth (MAALOX/MYLANTA) 200-200-20 MG/5ML suspension 30 mL  30 mL Oral Q4H PRN Kerry Hough, PA-C      . ARIPiprazole (ABILIFY) tablet 10 mg  10 mg Oral Daily Nehemiah Massed, MD   10 mg at 01/25/14 0751  . benztropine (COGENTIN) tablet 1 mg  1 mg Oral BID Beau Fanny, RN   1 mg at 01/25/14 0751  . DULoxetine (CYMBALTA) DR capsule 60 mg  60 mg Oral Daily Nehemiah Massed, MD   60 mg at 01/25/14 7846  . gabapentin (NEURONTIN) capsule 200 mg  200 mg Oral TID Nehemiah Massed, MD   200 mg at 01/25/14 0751  . gatorade [facility])  240  mL Oral TID BM PRN Beau FannyJohn C Withrow, FNP      . hydrOXYzine (ATARAX/VISTARIL) tablet 25 mg  25 mg Oral Q6H PRN Kerry HoughSpencer E Simon, PA-C   25 mg at 01/24/14 2116  . ibuprofen (ADVIL,MOTRIN) tablet 600 mg  600 mg Oral Q6H PRN Kerry HoughSpencer E Simon, PA-C   600 mg at 01/23/14 0500  . LORazepam (ATIVAN) tablet 1 mg  1 mg Oral Q8H PRN Nehemiah MassedFernando Cobos, MD   1 mg at 01/25/14 0751  . magnesium hydroxide (MILK OF MAGNESIA) suspension 30 mL  30 mL Oral Daily PRN Kerry HoughSpencer E Simon, PA-C      . nicotine (NICODERM CQ - dosed in mg/24 hours) patch 21  mg  21 mg Transdermal Q0600 Nehemiah MassedFernando Cobos, MD   21 mg at 01/25/14 0753  . traZODone (DESYREL) tablet 100 mg  100 mg Oral QHS PRN Nehemiah MassedFernando Cobos, MD   100 mg at 01/24/14 2116    Lab Results:  No results found for this or any previous visit (from the past 48 hour(s)).  Physical Findings: AIMS: Facial and Oral Movements Muscles of Facial Expression: None, normal Lips and Perioral Area: None, normal Jaw: None, normal Tongue: None, normal,Extremity Movements Upper (arms, wrists, hands, fingers): None, normal Lower (legs, knees, ankles, toes): None, normal, Trunk Movements Neck, shoulders, hips: None, normal, Overall Severity Severity of abnormal movements (highest score from questions above): None, normal Incapacitation due to abnormal movements: None, normal Patient's awareness of abnormal movements (rate only patient's report): No Awareness, Dental Status Current problems with teeth and/or dentures?: No Does patient usually wear dentures?: No  CIWA:  CIWA-Ar Total: 2 COWS:  COWS Total Score: 5  Assessment:  Mood is better, he is less depressed, but he does remain anxious, and subjectively restless.  Some ongoing WDL symptoms, but improved ( primarily some ongoing lose stools) He is tolerating medications well, to include Neurontin and Cymbalta, recently started to address depression, anxiety, and pain. [facility].   Treatment Plan Summary: Daily contact with patient to assess and evaluate symptoms and progress in treatment Medication management See below   Plan: Continue inpatient treatment. Neurontin 200 mgrs TID  Cymbalta 60 mgrs QAM. Abilify 10 mgrs QHS Due to severe anxiety , continue Ativan PRNs for anxiety, but he understands that the Ativan will be D/Cd prior to discharge. Completing Clonidine detox protocol.    Medical Decision Making Problem Points:  Established problem, stable/improving (1) and Review of last therapy  session (1) Data Points:  Review or order clinical lab tests (1) Review of new medications or change in dosage (2)  I certify that inpatient services furnished can reasonably be expected to improve the patient's condition.   Beau FannyWithrow, John C, FNP-BC 01/25/2014, 1:02 PM  Patient seen, evaluated and I agree with notes by Nurse Practitioner. Thedore MinsMojeed Anes Rigel, MD

## 2014-01-25 NOTE — [facility] Group Notes (Signed)
[facility] Group Notes:  (Clinical Social Work)  01/25/2014   1:15-2:15PM  Summary of Progress/Problems:  The main focus of today's process group was to   identify the patient's current support system and decide on other supports that can be put in place.  The picture on workbook was used to discuss why additional supports are needed.  An emphasis was placed on using counselor, doctor, therapy groups, 12-step groups, and problem-specific support groups to expand supports.   There was also an extensive discussion about what constitutes a healthy support versus an unhealthy support.  The patient expressed full comprehension of the concepts presented.  Current unhealthy support include his brother who, when patient attempted suicide, told him he did it wrong.  He stated he has to love his brother from a distance now.  Type of Therapy:  Process Group  Participation Level:  Active  Participation Quality:  Attentive and Sharing  Affect:  Blunted  Cognitive:  Appropriate and Oriented  Insight:  Engaged  Engagement in Therapy:  Engaged  Modes of Intervention:  Education,  Support and [employer] FoodsProcessing  Louann Hopson Grossman-Orr, LCSW 01/25/2014, 4:00pm

## 2014-01-26 MED ORDER — ARIPIPRAZOLE 10 MG PO TABS
10.0000 mg | ORAL_TABLET | Freq: Every day | ORAL | Status: DC
Start: 2014-01-26 — End: 2014-02-24

## 2014-01-26 MED ORDER — GABAPENTIN 100 MG PO CAPS: 200.0000 mg | ORAL_CAPSULE | Freq: Three times a day (TID) | ORAL | Status: DC

## 2014-01-26 MED ORDER — NICOTINE 21 MG/24HR TD PT24: 21.0000 mg | MEDICATED_PATCH | Freq: Every day | TRANSDERMAL | Status: DC

## 2014-01-26 MED ORDER — DULOXETINE HCL 60 MG PO CPEP: 60.0000 mg | ORAL_CAPSULE | Freq: Every day | ORAL | Status: DC

## 2014-01-26 MED ORDER — TRAZODONE HCL 100 MG PO TABS: 100.0000 mg | ORAL_TABLET | Freq: Every evening | ORAL | Status: DC | PRN

## 2014-01-26 MED ORDER — BENZTROPINE MESYLATE 1 MG PO TABS: 1.0000 mg | ORAL_TABLET | Freq: Two times a day (BID) | ORAL | Status: DC

## 2014-01-26 NOTE — Discharge Summary (Signed)
Physician Discharge Summary Note  Patient:  Alexander [name] is an 38 y.o., male MRN:  [id number] DOB:  February 27, 1976 Patient phone:  [phone] (home)  Patient address:   [address]  [place] [address],  Total Time spent with patient: 30 minutes  Date of Admission:  01/20/2014 Date of Discharge:  01/26/2014   Reason for Admission:  Depression  Discharge Diagnoses: Active Problems:   Bipolar 1 disorder, mixed, severe   Psychiatric Specialty Exam:    Please see D/C SRA Physical Exam  ROS  Blood pressure 99/61, pulse 87, temperature 97.7 F (36.5 C), temperature source Oral, resp. rate 18, height 5' 8.25" (1.734 m), weight 198 lb (89.812 kg).Body mass index is 29.87 kg/(m^2).   Past Psychiatric History:  Diagnosis:Has been diagnosed with Bipolar Disorder, but does not describe any clear history of mania or hypomania, but does state Alexander has [name] swings. Describes depression.   Hospitalizations: Several psychiatric admissions, to include recent admission to our unit in early July. States all admissions have been related to depression.   Outpatient Care: [facility], but had recently not followed up.   Substance Abuse Care: Was in a Rehab in [place] a few years ago   Self-Mutilation: History of self cutting and has scars on forearms, last time 8 years ago.   Suicidal Attempts:(+) suicide attempt by cutting forearm , also history of OD   Violent Behaviors: Denies violence.      DSM5:   Discharge Diagnoses:  AXIS I: Bipolar Disorder Depressed, Alcohol Dependence, Opiate Dependence, Opiate WDL, consider Substance Induced [name] Disorder  AXIS II: Deferred  AXIS III:  Past Medical History   Diagnosis  Date   .  Chronic back Alexander [name]    .  Bipolar disorder    .  Alexander     AXIS IV: economic problems, occupational problems and problems related to legal system/crime  AXIS V: 51-60 moderate [name] ( 60-65 upon discharge)   Level of Care:  OP  Hospital Course:   Alexander [name] is an 38 y.o. male presenting to [facility] ED due to suicidal ideations and worsening depression. Pt stated 'I took a bunch of Seroquel yesterday and I wanted my heart to stop".         Alexander [name] was admitted to the adult unit. Alexander was evaluated and his [name] were identified. Medication management was discussed and initiated. Alexander was oriented to the unit and encouraged to participate in unit programming. Medical problems were identified and treated appropriately. Home medication was restarted as needed.        The patient was evaluated each day by a clinical provider to ascertain the patient's response to treatment.  Improvement was noted by the patient's report of decreasing [name], improved sleep and appetite, affect, medication tolerance, behavior, and participation in unit programming.  Alexander [name], Alexander [name], Alexander [name], Alexander [name], Alexander [name].         Alexander responded well to medication and being in a therapeutic and supportive environment. Positive and appropriate behavior was noted and the patient was motivated for recovery.  The patient worked closely with the treatment team and case manager to develop a discharge plan with appropriate goals. Coping skills, problem solving as well as relaxation therapies were also part of the unit programming.         By the day of discharge Alexander was in much improved condition than upon admission.  [name] were reported as significantly  decreased or resolved completely.  The patient denied SI/HI and voiced no AVH. Alexander was motivated to continue taking medication with a goal of continued improvement in Alexander health.          Alexander [name] was discharged home with a plan to follow up as noted below.  Consults:  psychiatry  Significant Diagnostic Studies:  None  Discharge Vitals:   Blood pressure 99/61, pulse 87, temperature 97.7 F (36.5 C), temperature source Oral, resp.  rate 18, height 5' 8.25" (1.734 m), weight 198 lb (89.812 kg). Body mass index is 29.87 kg/(m^2). Lab Results:   No results found for this or any previous visit (from the past 72 hour(s)).  Physical Findings: AIMS: Facial and Oral Movements Muscles of Facial Expression: None, normal Lips and Perioral Area: None, normal Jaw: None, normal Tongue: None, normal,Extremity Movements Upper (arms, wrists, hands, fingers): None, normal Lower (legs, knees, ankles, toes): None, normal, Trunk Movements Neck, shoulders, hips: None, normal, Overall Severity Severity of abnormal movements (highest score from questions above): None, normal Incapacitation due to abnormal movements: None, normal Patient's awareness of abnormal movements (rate only patient's report): No Awareness, Dental [name] Current problems with teeth and/or dentures?: No Does patient usually wear dentures?: No  CIWA:  CIWA-Ar Total: 2 COWS:  COWS Total Score: 0  Psychiatric Specialty Exam: See Psychiatric Specialty Exam and Suicide Risk Assessment completed by Attending Physician prior to discharge.  Discharge destination:  Home  Is patient on multiple antipsychotic therapies at discharge:  No   Has Patient had three or more failed trials of antipsychotic monotherapy by history:  No  Recommended Plan for Multiple Antipsychotic Therapies: NA  Discharge Instructions   Diet - low sodium heart healthy    Complete by:  As directed      Discharge instructions    Complete by:  As directed   Take all of your medications as directed. Be sure to keep all of your follow up appointments.  If you are unable to keep your follow up appointment, call your Doctor's office to let them know, and reschedule.  Make sure that you have enough medication to last until your appointment. Be sure to get plenty of rest. Going to bed at the same time each night will help. Try to avoid sleeping during the day.  Increase your activity as tolerated. Regular  exercise will help you to sleep better and improve your Alexander health. Eating a heart healthy diet is recommended. Try to avoid salty or fried foods. Be sure to avoid all alcohol and illegal drugs.     Increase activity slowly    Complete by:  As directed             Medication List    STOP taking these medications       FLUoxetine 20 MG capsule  Commonly known as:  PROZAC     mirtazapine 45 MG tablet  Commonly known as:  REMERON      TAKE these medications     Indication   ARIPiprazole 10 MG tablet  Commonly known as:  ABILIFY  Take 1 tablet (10 mg total) by mouth daily.   Indication:  [name] control     benztropine 1 MG tablet  Commonly known as:  COGENTIN  Take 1 tablet (1 mg total) by mouth 2 (two) times daily.   Indication:  Extrapyramidal Reaction caused by Medications     DULoxetine 60 MG capsule  Commonly known as:  CYMBALTA  Take 1 capsule (  60 mg total) by mouth daily.   Indication:  Major Depressive Disorder     gabapentin 100 MG capsule  Commonly known as:  NEURONTIN  Take 2 capsules (200 mg total) by mouth 3 (three) times daily.   Indication:  Agitation, Neurogenic Alexander [name]     nicotine 21 mg/24hr patch  Commonly known as:  NICODERM CQ - dosed in mg/24 hours  Place 1 patch (21 mg total) onto the skin daily at 6 (six) AM.   Indication:  Nicotine Addiction     traZODone 100 MG tablet  Commonly known as:  DESYREL  Take 1 tablet (100 mg total) by mouth at bedtime as needed for sleep.   Indication:  Trouble Sleeping           Follow-up Information   Follow up with [facility] On 01/27/2014. (Tuesday, January 31, 2014 by 3:00 PM)    Contact information:   [address] [place], [place]   [id number]  [phone]      Follow-up recommendations:   Activities: Resume activity as tolerated. Diet: Heart healthy low sodium diet Tests: Follow up testing will be determined by your out patient provider. Comments:    Total Discharge Time:  Greater than 30  minutes.  Signed: Rona RavensNeil T. Mashburn RPAC 4:44 PM 01/26/2014 Patient seen, Suicide Assessment Completed.  Disposition Plan Reviewed

## 2014-01-26 NOTE — [facility] Suicide Risk Assessment (Signed)
   Demographic Factors:  38 year old single male.   Total Time spent with patient: 30 minutes  Psychiatric Specialty Exam: Physical Exam  ROS  Blood pressure 99/61, pulse 87, temperature 97.7 F (36.5 C), temperature source Oral, resp. rate 18, height 5' 8.25" (1.734 m), weight 89.812 kg (198 lb).Body mass index is 29.87 kg/(m^2).  General Appearance: Well Groomed  Patent attorneyye Contact::  Good  Speech:  Normal Rate  Volume:  Normal  Mood:  Euthymic  Affect:  Appropriate  Thought Process:  Linear  Orientation:  NA- fully alert and attentive   Thought Content:  at this time no hallucinations,no delusions, not internally preoccupied   Suicidal Thoughts:  No- denies any SI or HI  Homicidal Thoughts:  No  Memory:  NA  Judgement:  Good  Insight:  Fair  Psychomotor Activity:  Normal  Concentration:  Good  Recall:  Good  Fund of Knowledge:Good  Language: Good  Akathisia:  Negative  Handed:  Right  AIMS (if indicated):     Assets:  Communication Skills Desire for Improvement Resilience  Sleep:  Number of Hours: 6    Musculoskeletal: Strength & Muscle Tone: within normal limits Gait & Station: normal Patient leans: N/A   Mental Status Per Nursing Assessment::   On Admission:     Current Mental Status by Physician: At this time patient is improved, with improved mood, fuller range of affect, no thought disorder, no SI or HI, no psychotic symptoms  Loss Factors: Legal issues and financial issues   Historical Factors: Prior suicide attempts and Family history of mental illness or substance abuse  Risk Reduction Factors:   Sense of responsibility to family and Positive coping skills or problem solving skills  Continued Clinical Symptoms:  At this time patient is improved, mood improved, and affect fuller in range. No ongoing WDL symptoms currently  Cognitive Features That Contribute To Risk:  No gross cognitive deficits noted upon discharge   Suicide Risk:  Mild:  Suicidal  ideation of limited frequency, intensity, duration, and specificity.  There are no identifiable plans, no associated intent, mild dysphoria and related symptoms, good self-control (both objective and subjective assessment), few other risk factors, and identifiable protective factors, including available and accessible social support.  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 Chronic back pain   . Bipolar disorder   . Anxiety    AXIS IV:  economic problems, occupational problems and problems related to legal system/crime AXIS V:  51-60 moderate symptoms ( 60-65 upon discharge)   Plan Of Care/Follow-up recommendations:  Activity:  As tolerated Diet:  Regular Tests:  NA Other:  See below   Is patient on multiple antipsychotic therapies at discharge:  No   Has Patient had three or more failed trials of antipsychotic monotherapy by history:  No  Recommended Plan for Multiple Antipsychotic Therapies: NA  Patient is leaving our unit in good spirits. [facility] and has bed as of tomorrow. [facility]. [facility], [facility] For medical issues as needed , [facility].   Phyllicia Dudek 01/26/2014, 10:48 AM

## 2014-01-26 NOTE — Progress Notes (Signed)
Patient ID: Alexander MaltaChristopher [name], male   DOB: 1976-05-21, 38 y.o.   MRN: [id number] Patient is discharged ambulatory to ride bus home and get ride [facility].  He denies SI/HI.  He verbalizes understanding of his discharge meds and follow up.  He was given scripts and a 30 day supply of meds to take to treatment.  He is hopeful about the future.

## 2014-01-26 NOTE — [facility] Group Notes (Signed)
[facility] Note   01/26/2014 10:24 AM    Participation Quality:  Appropraite  Mood/Affect:  Appropriate  Depression Rating:  0  Anxiety Rating:  5  Thoughts of Suicide:  No  Will you contract for safety?   NA  Current AVH:  No  Plan for Discharge/Comments:  Patient attended discharge planning group and actively participated in group.  He reports doing well and ready to discharge.  He shared anxiety is due to being discharge.  He is looking [facility] [place].  CSW provided all participants with daily workbook.   Transportation Means: Patient has transportation.   Supports:  Patient has a support system.   Camika Marsico, Joesph JulyQuylle Hairston

## 2014-01-26 NOTE — Progress Notes (Signed)
[facility] :  Will you be returning to the same living situation after discharge:No.  Patient scheduled to admit to [facility] for residential treatment At discharge, do you have transportation home?:Yes,  Patient assisted with bus pass. Do you have the ability to pay for your medications:No.  Patient needs assistance with indigent medications   Release of information consent forms completed and in the chart;  Patient's signature needed at discharge.  Patient to Follow up at: Follow-up Information   Follow up with [facility] On 01/27/2014. (Tuesday, January 31, 2014 by 3:00 PM)    Contact information:   [address] [place], [place]   [id number]  [phone]      Patient denies SI/HI: Patient no longer endorsing SI/HI or other thoughts of self harm.   Safety Planning and Suicide Prevention discussed:  .Reviewed with all patients during discharge planning group   Alexander [name], Alexander [name] 01/26/2014, 10:25 AM

## 2014-01-28 NOTE — Progress Notes (Signed)
Patient Discharge Instructions:  After Visit Summary (AVS):   Faxed to:  01/28/14 Discharge Summary Note:   Faxed to:  01/28/14 Psychiatric Admission Assessment Note:   Faxed to:  01/28/14 Suicide Risk Assessment - Discharge Assessment:   Faxed to:  01/28/14 Faxed/Sent to the Next Level Care provider:  01/28/14 [facility]ax to [facility] @ [phone]  Jerelene ReddenSheena E Hope, 01/28/2014, 3:38 PM

## 2014-02-24 ENCOUNTER — Emergency Department (HOSPITAL_COMMUNITY): Payer: Self-pay

## 2014-02-24 ENCOUNTER — Emergency Department (HOSPITAL_COMMUNITY)
Admission: EM | Admit: 2014-02-24 | Discharge: 2014-02-24 | Discharge status: Against Medical Advice | Payer: Self-pay | Attending: Emergency Medicine | Admitting: Emergency Medicine

## 2014-02-24 ENCOUNTER — Encounter (HOSPITAL_COMMUNITY): Payer: Self-pay | Admitting: Emergency Medicine

## 2014-02-24 DIAGNOSIS — S0083XA Contusion of other part of head, initial encounter: Secondary | ICD-10-CM | POA: Insufficient documentation

## 2014-02-24 DIAGNOSIS — Y9289 Other specified places as the place of occurrence of the external cause: Secondary | ICD-10-CM | POA: Insufficient documentation

## 2014-02-24 DIAGNOSIS — Z79899 Other long term (current) drug therapy: Secondary | ICD-10-CM | POA: Insufficient documentation

## 2014-02-24 DIAGNOSIS — F411 Generalized anxiety disorder: Secondary | ICD-10-CM | POA: Insufficient documentation

## 2014-02-24 DIAGNOSIS — F17200 Nicotine dependence, unspecified, uncomplicated: Secondary | ICD-10-CM | POA: Insufficient documentation

## 2014-02-24 DIAGNOSIS — S1093XA Contusion of unspecified part of neck, initial encounter: Secondary | ICD-10-CM

## 2014-02-24 DIAGNOSIS — S0003XA Contusion of scalp, initial encounter: Secondary | ICD-10-CM | POA: Insufficient documentation

## 2014-02-24 DIAGNOSIS — Y9389 Activity, other specified: Secondary | ICD-10-CM | POA: Insufficient documentation

## 2014-02-24 DIAGNOSIS — S0990XA Unspecified injury of head, initial encounter: Secondary | ICD-10-CM | POA: Insufficient documentation

## 2014-02-24 DIAGNOSIS — F319 Bipolar disorder, unspecified: Secondary | ICD-10-CM | POA: Insufficient documentation

## 2014-02-24 DIAGNOSIS — Z23 Encounter for immunization: Secondary | ICD-10-CM | POA: Insufficient documentation

## 2014-02-24 DIAGNOSIS — G8929 Other chronic pain: Secondary | ICD-10-CM | POA: Insufficient documentation

## 2014-02-24 DIAGNOSIS — IMO0002 Reserved for concepts with insufficient information to code with codable children: Secondary | ICD-10-CM | POA: Insufficient documentation

## 2014-02-24 MED ORDER — TETANUS-DIPHTH-ACELL PERTUSSIS 5-2.5-18.5 LF-MCG/0.5 IM SUSP
0.5000 mL | Freq: Once | INTRAMUSCULAR | Status: AC
  Administered 2014-02-24: 0.5 mL via INTRAMUSCULAR
  Filled 2014-02-24: qty 0.5

## 2014-02-24 MED ORDER — IBUPROFEN 800 MG PO TABS
800.0000 mg | ORAL_TABLET | Freq: Once | ORAL | Status: AC
  Administered 2014-02-24: 800 mg via ORAL
  Filled 2014-02-24: qty 1

## 2014-02-24 NOTE — ED Provider Notes (Signed)
Medical screening examination/treatment/procedure(s) were performed by non-physician practitioner and as supervising physician I was immediately available for consultation/collaboration.   EKG Interpretation None        Audree Camel, MD 02/24/14 [phone]

## 2014-02-24 NOTE — ED Notes (Signed)
Pt states he was using a hammer pulling on a wall joist and the hammer slipped and came back and hit him between the eyes  Pt states he did not black out at the time  States it happened about 3pm on Monday afternoon  Pt states he finished to job, took his friend home, and then drove here but fell asleep in his car til now  Pt states he is seeing black spots

## 2014-02-24 NOTE — ED Notes (Signed)
Pt left without waiting for AVS; states, "I can't wait".

## 2014-02-24 NOTE — ED Provider Notes (Signed)
CSN: [id number]     Arrival date & time 02/24/14  0103 History   First MD Initiated Contact with Patient 02/24/14 0116     Chief Complaint  Patient presents with  . Head Injury    (Consider location/radiation/quality/duration/timing/severity/associated sxs/prior Treatment) HPI Comments: 38 year old male with a history of chronic back pain and bipolar disorder presents to the emergency department for further evaluation of head injury. Patient states that he was pulling on a wall joist at 1500 when a metal hammer came back and struck him in his head between his eyes. No LOC. Patient endorses a throbbing pain sensation for which she took 2 Norco just prior to evaluation this evening. He states that, after arriving to the ED, he felt very sleepy and slept in the car. He denies vision loss, hearing changes, nausea, vomiting, gait difficulty, and extremity numbness/weakness.  Patient is a 38 y.o. male presenting with head injury. The history is provided by the patient. No language interpreter was used.  Head Injury Associated symptoms: no nausea, no numbness and no vomiting     Past Medical History  Diagnosis Date  . Chronic back pain   . Bipolar disorder   . Anxiety    Past Surgical History  Procedure Laterality Date  . Hernia repair     Family History  Problem Relation Age of Onset  . Hypertension Other    History  Substance Use Topics  . Smoking status: Current Every Day Smoker -- 1.00 packs/day    Types: Cigarettes  . Smokeless tobacco: Never Used  . Alcohol Use: No     Comment: occ    Review of Systems  HENT: Negative for trouble swallowing.        +head injury  Eyes: Negative for visual disturbance.  Gastrointestinal: Negative for nausea and vomiting.  Skin: Positive for wound.  Neurological: Negative for syncope, weakness and numbness.  All other systems reviewed and are negative.    Allergies  Review of patient's allergies indicates no known allergies.  Home  Medications   Prior to Admission medications   Medication Sig Start Date End Date Taking? Authorizing Provider  Cyanocobalamin (VITAMIN B-12 CR PO) Take 1 tablet by mouth daily.   Yes Historical Provider, MD  FLUoxetine (PROZAC) 20 MG tablet Take 20 mg by mouth daily.   Yes Historical Provider, MD  gabapentin (NEURONTIN) 100 MG capsule Take 2 capsules (200 mg total) by mouth 3 (three) times daily. 01/26/14  Yes Verne Spurr, PA-C  traZODone (DESYREL) 100 MG tablet Take 1 tablet (100 mg total) by mouth at bedtime as needed for sleep. 01/26/14  Yes Neil Mashburn, PA-C   BP 109/76  Pulse 92  Temp(Src) 98.2 F (36.8 C) (Oral)  Resp 18  Ht  (1.702 m)  SpO2 97%  Physical Exam  Nursing note and vitals reviewed. Constitutional: He is oriented to person, place, and time. He appears well-developed and well-nourished. No distress.  HENT:  Head: Normocephalic. Head is with abrasion and with contusion.    Nose:    Mouth/Throat: Uvula is midline, oropharynx is clear and moist and mucous membranes are normal.  Eyes: Conjunctivae and EOM are normal. No scleral icterus.  Neck: Normal range of motion.  Cardiovascular: Normal rate, regular rhythm and intact distal pulses.   Distal radial pulse 2+ bilaterally  Pulmonary/Chest: Effort normal. No respiratory distress.  Musculoskeletal: Normal range of motion.  Neurological: He is alert and oriented to person, place, and time. No cranial nerve deficit. He exhibits  normal muscle tone. Coordination normal.  GCS 15. Patient speaks in full goal oriented sentences and follow simple commands. No cranial nerve deficits appreciated; symmetric eyebrow raise, no facial drooping, tongue midline. Equal grip strength and 5/5 strength against resistance in all extremities. No gross sensory deficits appreciated. Patient moves extremities without ataxia; finger to nose intact. Patient ambulatory with normal gait.  Skin: Skin is warm and dry. No rash noted. He is not  diaphoretic. No erythema. No pallor.  Psychiatric: He has a normal mood and affect. His behavior is normal.    ED Course  Procedures (including critical care time) Labs Review Labs Reviewed - No data to display  Imaging Review Ct Head Wo Contrast  02/24/2014   CLINICAL DATA:  HEAD INJURY  EXAM: CT HEAD WITHOUT CONTRAST  TECHNIQUE: Contiguous axial images were obtained from the base of the skull through the vertex without intravenous contrast.  COMPARISON:  None.  FINDINGS: Maintained gray-white differentiation. No CT evidence of an acute infarction. No intraparenchymal hemorrhage, mass, mass effect, or abnormal extra-axial fluid collection. The ventricles, cisterns, and sulci are normal in size, shape, and position. The visualized paranasal sinuses and mastoid air cells are predominantly clear. No displaced calvarial fracture. Mild soft tissue swelling just superior to the nasal bridge (series 3, image 12).  IMPRESSION: No CT evidence of an acute intracranial abnormality.  Mild soft tissue swelling just superior to the nasal bridge.   Electronically Signed   By: Jearld Lesch M.D.   On: 02/24/2014 02:21     EKG Interpretation None      MDM   Final diagnoses:  Head injury, initial encounter    38 year old male presents to the emergency department for further evaluation of head injury. Patient states that he was hit in the head with a metal hammer at 1500 yesterday. Patient endorses increased sleepiness. He denies loss of consciousness at time of injury as well as since the injury. He further denies vision loss, nausea, vomiting, or other concussive symptoms.  Patient noted to have a contusion to his nasal bridge with 2 deep abrasions. Given mechanism of injury, CT ordered for further evaluation. CT today shows no evidence of fracture, bony deformity, or hemorrhage. Neurologic exam today nonfocal and patient has been ambulatory with normal gait.  Patient left the emergency department  prior to receiving results of imaging. He told the nurse that he needed to leave "to go smoke". Patient will be dispositioned as AMA as a result findings were not reviewed with him and intent was to place patient on head injury precautions for one week. This should be communicated to the patient, if he presents at a later time.   Filed Vitals:   02/24/14 0108 02/24/14 0229  BP: 125/78 109/76  Pulse: 100 92  Temp: 98.2 F (36.8 C) 98.2 F (36.8 C)  TempSrc: Oral Oral  Resp: 20 18  Height:  (1.702 m)   SpO2: 95% 97%       Antony Madura, PA-C 02/24/14 [phone]

## 2014-03-09 ENCOUNTER — Emergency Department (HOSPITAL_COMMUNITY)
Admission: EM | Admit: 2014-03-09 | Discharge: 2014-03-10 | Disposition: A | Discharge status: Home / Self Care | Payer: Self-pay | Attending: Emergency Medicine | Admitting: Emergency Medicine

## 2014-03-09 ENCOUNTER — Emergency Department (HOSPITAL_COMMUNITY)
Admission: EM | Admit: 2014-03-09 | Discharge: 2014-03-09 | Disposition: A | Discharge status: Home / Self Care | Payer: Self-pay | Attending: Emergency Medicine | Admitting: Emergency Medicine

## 2014-03-09 ENCOUNTER — Emergency Department (HOSPITAL_COMMUNITY): Payer: Self-pay

## 2014-03-09 ENCOUNTER — Encounter (HOSPITAL_COMMUNITY): Payer: Self-pay | Admitting: Emergency Medicine

## 2014-03-09 DIAGNOSIS — F411 Generalized anxiety disorder: Secondary | ICD-10-CM | POA: Insufficient documentation

## 2014-03-09 DIAGNOSIS — Z79899 Other long term (current) drug therapy: Secondary | ICD-10-CM | POA: Insufficient documentation

## 2014-03-09 DIAGNOSIS — R0682 Tachypnea, not elsewhere classified: Secondary | ICD-10-CM | POA: Insufficient documentation

## 2014-03-09 DIAGNOSIS — F17200 Nicotine dependence, unspecified, uncomplicated: Secondary | ICD-10-CM | POA: Insufficient documentation

## 2014-03-09 DIAGNOSIS — N179 Acute kidney failure, unspecified: Secondary | ICD-10-CM | POA: Insufficient documentation

## 2014-03-09 DIAGNOSIS — F1120 Opioid dependence, uncomplicated: Secondary | ICD-10-CM | POA: Insufficient documentation

## 2014-03-09 DIAGNOSIS — R079 Chest pain, unspecified: Secondary | ICD-10-CM | POA: Insufficient documentation

## 2014-03-09 DIAGNOSIS — G8929 Other chronic pain: Secondary | ICD-10-CM | POA: Insufficient documentation

## 2014-03-09 DIAGNOSIS — F1021 Alcohol dependence, in remission: Secondary | ICD-10-CM | POA: Insufficient documentation

## 2014-03-09 HISTORY — DX: Reserved for inherently not codable concepts without codable children: IMO0001

## 2014-03-09 HISTORY — DX: Opioid abuse, uncomplicated: F11.10

## 2014-03-09 HISTORY — DX: Alcohol dependence, uncomplicated: F10.20

## 2014-03-09 LAB — CBC
HCT: 41.3 % (ref 39.0–52.0)
HEMATOCRIT: 39.8 % (ref 39.0–52.0)
HEMOGLOBIN: 14.4 g/dL (ref 13.0–17.0)
HEMOGLOBIN: 13.7 g/dL (ref 13.0–17.0)
MCH: 29.1 pg (ref 26.0–34.0)
MCH: 29.3 pg (ref 26.0–34.0)
MCHC: 34.9 g/dL (ref 30.0–36.0)
MCHC: 34.4 g/dL (ref 30.0–36.0)
MCV: 83.6 fL (ref 78.0–100.0)
MCV: 85.2 fL (ref 78.0–100.0)
Platelets: 241 10*3/uL (ref 150–400)
Platelets: 255 10*3/uL (ref 150–400)
RBC: 4.67 MIL/uL (ref 4.22–5.81)
RBC: 4.94 MIL/uL (ref 4.22–5.81)
RDW: 12.7 % (ref 11.5–15.5)
RDW: 12.8 % (ref 11.5–15.5)
WBC: 9.5 10*3/uL (ref 4.0–10.5)
WBC: 9.7 10*3/uL (ref 4.0–10.5)

## 2014-03-09 LAB — COMPREHENSIVE METABOLIC PANEL
ALK PHOS: 188 U/L — AB (ref 39–117)
ALT: 20 U/L (ref 0–53)
AST: 18 U/L (ref 0–37)
Albumin: 4.1 g/dL (ref 3.5–5.2)
Anion gap: 15 (ref 5–15)
BILIRUBIN TOTAL: 0.6 mg/dL (ref 0.3–1.2)
BUN: 25 mg/dL — ABNORMAL HIGH (ref 6–23)
CO2: 26 meq/L (ref 19–32)
CREATININE: 1.98 mg/dL — AB (ref 0.50–1.35)
Calcium: 9.5 mg/dL (ref 8.4–10.5)
Chloride: 101 mEq/L (ref 96–112)
GFR calc Af Amer: 47 mL/min — ABNORMAL LOW (ref >90)
GFR, EST NON AFRICAN AMERICAN: 41 mL/min — AB (ref >90)
Glucose, Bld: 123 mg/dL — ABNORMAL HIGH (ref 70–99)
POTASSIUM: 3.9 meq/L (ref 3.7–5.3)
SODIUM: 142 meq/L (ref 137–147)
Total Protein: 7.3 g/dL (ref 6.0–8.3)

## 2014-03-09 LAB — TROPONIN I: Troponin I: <0.3 ng/mL (ref <0.30)

## 2014-03-09 MED ORDER — SODIUM CHLORIDE 0.9 % IV BOLUS (SEPSIS)
1000.0000 mL | Freq: Once | INTRAVENOUS | Status: AC
  Administered 2014-03-09: 1000 mL via INTRAVENOUS

## 2014-03-09 MED ORDER — ASPIRIN 325 MG PO TABS
325.0000 mg | ORAL_TABLET | ORAL | Status: AC
  Administered 2014-03-09: 325 mg via ORAL
  Filled 2014-03-09: qty 1

## 2014-03-09 MED ORDER — IPRATROPIUM-ALBUTEROL 0.5-2.5 (3) MG/3ML IN SOLN
3.0000 mL | RESPIRATORY_TRACT | Status: DC
  Administered 2014-03-09: 3 mL via RESPIRATORY_TRACT
  Filled 2014-03-09: qty 3

## 2014-03-09 MED ORDER — IOHEXOL 350 MG/ML SOLN
80.0000 mL | Freq: Once | INTRAVENOUS | Status: AC | PRN
  Administered 2014-03-09: 80 mL via INTRAVENOUS

## 2014-03-09 NOTE — ED Notes (Signed)
Pt is unable to urinate at this time. 

## 2014-03-09 NOTE — ED Notes (Signed)
EMS called to home.  Found patient with complaining of chest tightness with radiation to his back. Patient states that this has been going on for 2 weeks.  Currently he rates his pain 8 of 10.

## 2014-03-09 NOTE — Discharge Instructions (Signed)
Kidney Failure Mr. Meggison, you were seen today for chest pain.  Your CT did not show an abnormalities.  Your kidney function is not normal.  Today it is 2 and normal is 1.  You were given IV fluids to help hydrate you.  Continue to drink plenty of water and follow up with your regular doctor in 3 days for continued care.  If your symptoms do worsen, return to the ED immediately for repeat evaluation.  Thank you. Kidney failure happens when the kidneys cannot remove waste and excess fluid that naturally builds up in your blood after your body breaks down food. This leads to a dangerous buildup of waste products and fluid in the blood. HOME CARE  Follow your diet as told by your doctor.  Take all medicines as told by your doctor.  Keep all of your dialysis appointments. Call if you are unable to keep an appointment. GET HELP RIGHT AWAY IF:   You make a lot more or very little pee (urine).  Your face or ankles puff up (swell).  You develop shortness of breath.  You develop weakness, feel tired, or you do not feel hungry (appetite loss).  You feel poorly for no known reason. MAKE SURE YOU:   Understand these instructions.  Will watch your condition.  Will get help right away if you are not doing well or get worse. Document Released: 09/06/2009 Document Revised: 09/04/2011 Document Reviewed: 10/13/2009 [facility] Patient Information 2015 [place], [place]. This information is not intended to replace advice given to you by your health care provider. Make sure you discuss any questions you have with your health care provider. Chest Pain (Nonspecific) It is often hard to give a diagnosis for the cause of chest pain. There is always a chance that your pain could be related to something serious, such as a heart attack or a blood clot in the lungs. You need to follow up with your doctor. HOME CARE  If antibiotic medicine was given, take it as directed by your doctor. Finish the medicine even if  you start to feel better.  For the next few days, avoid activities that bring on chest pain. Continue physical activities as told by your doctor.  Do not use any tobacco products. This includes cigarettes, chewing tobacco, and e-cigarettes.  Avoid drinking alcohol.  Only take medicine as told by your doctor.  Follow your doctor's suggestions for more testing if your chest pain does not go away.  Keep all doctor visits you made. GET HELP IF:  Your chest pain does not go away, even after treatment.  You have a rash with blisters on your chest.  You have a fever. GET HELP RIGHT AWAY IF:   You have more pain or pain that spreads to your arm, neck, jaw, back, or belly (abdomen).  You have shortness of breath.  You cough more than usual or cough up blood.  You have very bad back or belly pain.  You feel sick to your stomach (nauseous) or throw up (vomit).  You have very bad weakness.  You pass out (faint).  You have chills. This is an emergency. Do not wait to see if the problems will go away. Call your local emergency services (911 in [place] Do not drive yourself to the hospital. MAKE SURE YOU:   Understand these instructions.  Will watch your condition.  Will get help right away if you are not doing well or get worse. Document Released: 11/29/2007 Document Revised: 06/17/2013 Document  Reviewed: 11/29/2007 ExitCare Patient Information 2015 [place], [place]. This information is not intended to replace advice given to you by your health care provider. Make sure you discuss any questions you have with your health care provider.   Emergency Department Resource Guide 1) Find a Doctor and Pay Out of Pocket Although you won't have to find out who is covered by your insurance plan, it is a good idea to ask around and get recommendations. You will then need to call the office and see if the doctor you have chosen will accept you as a new patient and what types of options they  offer for patients who are self-pay. Some doctors offer discounts or will set up payment plans for their patients who do not have insurance, but you will need to ask so you aren't surprised when you get to your appointment.  2) Contact Your Local Health Department Not all health departments have doctors that can see patients for sick visits, but many do, so it is worth a call to see if yours does. If you don't know where your local health department is, you can check in your phone book. The CDC also has a tool to help you locate your state's health department, and many state websites also have listings of all of their local health departments.  3) Find a Walk-in Clinic If your illness is not likely to be very severe or complicated, you may want to try a walk in clinic. These are popping up all over the country in pharmacies, drugstores, and shopping centers. They're usually staffed by nurse practitioners or physician assistants that have been trained to treat common illnesses and complaints. They're usually fairly quick and inexpensive. However, if you have serious medical issues or chronic medical problems, these are probably not your best option.  No Primary Care Doctor: - Call Health Connect at  [phone] - they can help you locate a primary care doctor that  accepts your insurance, provides certain services, etc. - Physician Referral Service- [phone]  Chronic Pain Problems: Organization         Address  Phone   Notes  [facility]  [phone] Patients need to be referred by their primary care doctor.   Medication Assistance: Organization         Address  Phone   Notes  [facility] Medication [facility] [address] [place], [place] [address] [phone] --Must be a resident of [facility] -- Must have NO insurance coverage whatsoever (no Medicaid/ Medicare, etc.) -- The pt. MUST have a primary care doctor that directs their care  regularly and follows them in the community   [facility]ist  [phone]   [employer]  [phone]    Agencies that provide inexpensive medical care: Organization         Address  Phone   Notes  [facility]  [phone]   [facility]    [phone]   [facility] [address] [place], [place] [address] [phone]   [facility] [address] [place] [place] [phone]   Planned Parenthood    [phone]   [facility]    [phone]   Community Health and [facility]  [address], [place] Phone:  [phone], Fax:  [phone] Hours of Operation:  9 am - 6 pm, M-F.  Also accepts Medicaid/Medicare and self-pay.  [facility]  [address], [place] Phone: [phone], Fax: [phone]. Hours of Operation:  8:30 am - 5:30 pm, M-F.  Also accepts Medicaid and self-pay.  [facility] [place] [address] [place] Phone: [phone]   [facility] [address] Natasha Bence [place], [place] [phone], Ext. [phone] Mondays & Thursdays: 7-9 AM.  First 15 patients are seen on a first come, first serve basis.    Medicaid-accepting [facility] Providers:  Organization         Address  Phone   Notes  [facility] [address], [place] [phone] Also accepts self-pay patients.  [facility] [address] Laurell Josephs [place], [place]  [phone]   [facility] [address], [place] [phone]   [facility] [address], [place] [phone]   Renaye Rakers [address], [place]   [phone] Only accepts [place] Access [place] patients after they have their name applied to their card.   Self-Pay (no insurance) in [place]:  Organization         Address  Phone    Notes  Sickle Cell Patients, [facility] [address] [place], [place] [phone]   [facility] [address] [place], [place] [phone]   [facility]  [address], [place] [phone]   [facility]/Dr. Osei-Bonsu  [address] [place] or [address], [place] [phone] Phone number for both [place] and [place] locations is the same.  [facility] [address], [place] [phone]   [facility] [address] [place] or [address] [phone]   [facility] [address] [place] [phone], phone; [phone], fax Sees patients 1st and 3rd Saturday of every month.  Must not qualify for public or private insurance (i.e. Medicaid, Medicare, [place] Health Choice, Veterans' Benefits)  Household income should be no more than 200% of the poverty level The clinic cannot treat you if you are pregnant or think you are pregnant  Sexually transmitted diseases are not treated at the clinic.    Dental Care: Organization         Address  Phone  Notes  [facility] Department of [facility] [place] [address] [place], [place] [phone] Accepts children up to age 72 who are enrolled in [place] or [place] Health Choice; pregnant women with a Medicaid card; and children who have applied for Medicaid or [place] Health Choice, but were declined, whose parents can pay a reduced fee at time of service.  [facility] Department of [facility]  [address], [place] [phone] Accepts children up to age 64 who are enrolled in [place] or [place] Health Choice; pregnant women with a Medicaid card; and children who have applied for Medicaid or [place] Health Choice, but were declined, whose parents can pay a reduced fee at time of service.  [facility]  [address] [place], [place] [phone] Patients are seen by appointment only. Walk-ins are not accepted. [facility] will see patients 10 years of age and older. Monday - Tuesday (8am-5pm) Most Wednesdays (8:30-5pm) $30 per  visit, cash only  [facility] Adult [employer] PROGRAM  [address], [facility] [phone] Patients are seen by appointment only. Walk-ins are not accepted. [facility] will see patients 69 years of age and older. One Wednesday Evening (Monthly: Volunteer Based).  $30 per visit, cash only  [employer]  [phone] for adults; Children under age 43, call Graduate Pediatric Dentistry at [phone]. Children aged 49-14, please call [phone] to request a pediatric application.  Dental services are provided in all areas of dental care including fillings, crowns and bridges, complete and partial dentures, implants, gum treatment, root canals, and extractions. Preventive care is also provided. Treatment is provided to both adults and children. Patients are selected via a lottery and there is often a waiting list.   [facility] [address], [place]  [phone] [email or web address]   [facility] [address] [place], [place] [phone], Ext. [phone] Second and Fourth Thursday of each month, opens at 6:30 AM; Clinic ends at 9 AM.  Patients are seen on a first-come first-served basis, and a limited number are seen during each clinic.   [facility]  [address] Ether Griffins [place], [place] [phone]   Eligibility Requirements You must have lived in [place], [place], or [place] counties for at least the last three months.   You cannot be eligible for state or federal sponsored [place], including [employer], [place], or [employer].   You generally cannot be eligible for healthcare insurance through your employer.    How to  apply: Eligibility screenings are held every Tuesday and Wednesday afternoon from 1:00 pm until 4:00 pm. You do not need an appointment for the interview!  [facility] [address] [place], [place] [phone]   [facility] Health Department  [phone]   [facility] Health Department  [phone]   [facility] Health Department  [phone]    Behavioral Health Resources in the Community: Intensive Outpatient Programs Organization         Address  Phone  Notes  [facility] [address] [place], [place] [phone]   [facility] Outpatient [address], [place], [place] [phone]   [facility] [address] [place], [place]  [phone]   [facility] Mental Health [address],  [place], [place] [phone] or [phone]   Substance Abuse Resources Organization         Address  Phone  Notes  [facility] Drug Services  [phone]   [facility]  [phone]   [facility] [place]  [phone]   Floydene Flock  [phone]   Residential & Outpatient Substance Abuse Program  [phone]   Psychological Services Organization         Address  Phone  Notes  [facility]al Health  [phone]   [facility] Services  [phone]   [facility] Mental Health [address] [place] [phone] or [phone]    Mobile Crisis Teams Organization         Address  Phone  Notes  [facility], Mobile Crisis Care Unit  [phone]   [facility]  [address] [place], [place] [phone]   Doristine Locks [address] [place] [phone]    Self-Help/Support Groups Organization         Address  Phone  Notes  [facility] [place] - variety of support groups  336- I7437963 Call for more information  Narcotics Anonymous (NA), [facility] [address], [employer] [place]  2 meetings at this location   Statistician         Address  Phone  Notes  [facility] [address],    [place]  [phone]   [facility]  [address] [place] [address], [place], [place] [phone]   [facility] [address] [place], [place] [phone] Admissions: 8am-3pm M-F  [facility] [address]    [place], [place] [phone]   [facility] [address] [place], [place], [place] [phone]   [facility] [address]  [place], [place] [phone]   [facility] - Intensive Outpatient [address] Laurell Josephs [address], [place], [place] [phone]   [facility] ([facility] [address] [place]  [place], [place] [phone] or [phone]   [facility] ([facility]) [address], [place], [place] [phone] Accepts Medicaid  [facility] [address],  [place] [phone] Substance Abuse/Addiction Treatment   [facility] Organization         Address  Phone  Notes  [facility]  [phone]   Angie Fava, PhD [address] Ervin Knack [place], [place]   [phone] or [phone]   [facility] Behavioral   [address] [place], [place] [phone]   [facility] [address], [place], [place] [phone] Insurance/Medicaid/sponsorship through [facility] and Families [address]                                    [place], [place] [phone] Therapy/tele-psych/case  [facility] [address], [place] [phone]    Dr. Lolly Mustache  [phone]   [facility]  [employer] [facility] Dept. 1) [address],  2) [address] [place] 3)  [address], [place] [phone]  [phone]   [facility] Child Abuse Hotline  731-297-9030 or [phone] (After Hours)

## 2014-03-09 NOTE — ED Notes (Signed)
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 Pt states that he drank and did heroin last night. Pt is currently living in a recovery house, which he left last night. Pt also c/o chronic back pain since 2004.

## 2014-03-09 NOTE — ED Provider Notes (Signed)
CSN: [id number]     Arrival date & time 03/09/14  0250 History   First MD Initiated Contact with Patient 03/09/14 0303     Chief Complaint  Patient presents with  . Chest Pain     (Consider location/radiation/quality/duration/timing/severity/associated sxs/prior Treatment) HPI [name] is a 38yo male with no known PMH here with chest pain.  It is left sided with radiation to his back.  He has associated numbness in his L arm and intermittently in his L leg, SOB, and diaphoresis.  He states his pain is no exertional, but worse when he smokes cigarettes or lifts heavy things for work.  He has sick contacts where he lives, dorm style living.  He admits to a productive cough, but no fevers.  He is still currently feeling the chest pain.  He denies any significant history of this in the past.  10 Systems reviewed and are negative for acute change except as noted in the HPI.    Past Medical History  Diagnosis Date  . Chronic back pain   . Bipolar disorder   . Anxiety    Past Surgical History  Procedure Laterality Date  . Hernia repair     Family History  Problem Relation Age of Onset  . Hypertension Other    History  Substance Use Topics  . Smoking status: Current Every Day Smoker -- 1.00 packs/day    Types: Cigarettes  . Smokeless tobacco: Never Used  . Alcohol Use: No     Comment: occ    Review of Systems    Allergies  Review of patient's allergies indicates no known allergies.  Home Medications   Prior to Admission medications   Medication Sig Start Date End Date Taking? Authorizing Provider  Cyanocobalamin (VITAMIN B-12 CR PO) Take 1 tablet by mouth daily.   Yes Historical Provider, MD  FLUoxetine (PROZAC) 20 MG tablet Take 20 mg by mouth daily.   Yes Historical Provider, MD  ibuprofen (ADVIL,MOTRIN) 200 MG tablet Take 1,000 mg by mouth every 6 (six) hours as needed for moderate pain.   Yes Historical Provider, MD  oxyCODONE-acetaminophen (PERCOCET) 10-325 MG  per tablet Take 1 tablet by mouth every 8 (eight) hours as needed for pain.   Yes Historical Provider, MD  traZODone (DESYREL) 100 MG tablet Take 1 tablet (100 mg total) by mouth at bedtime as needed for sleep. 01/26/14  Yes Neil Mashburn, PA-C   BP 91/45  Pulse 83  Temp(Src) 98.4 F (36.9 C) (Oral)  Resp 17  SpO2 93% Physical Exam  Nursing note and vitals reviewed. Constitutional: He is oriented to person, place, and time. Vital signs are normal. He appears well-developed and well-nourished.  Non-toxic appearance. He does not appear ill. No distress.  HENT:  Head: Normocephalic and atraumatic.  Nose: Nose normal.  Mouth/Throat: Oropharynx is clear and moist. No oropharyngeal exudate.  Eyes: Conjunctivae and EOM are normal. Pupils are equal, round, and reactive to light. No scleral icterus.  Neck: Normal range of motion. Neck supple. No tracheal deviation, no edema, no erythema and normal range of motion present. No mass and no thyromegaly present.  Cardiovascular: Normal rate, regular rhythm, S1 normal, S2 normal, normal heart sounds, intact distal pulses and normal pulses.  Exam reveals no gallop and no friction rub.   No murmur heard. Pulses:      Radial pulses are 2+ on the right side, and 2+ on the left side.       Dorsalis pedis pulses are 2+  on the right side, and 2+ on the left side.  Pulmonary/Chest: Effort normal and breath sounds normal. No respiratory distress. He has no wheezes. He has no rhonchi. He has no rales.  Borderline tachypnea  Abdominal: Soft. Normal appearance and bowel sounds are normal. He exhibits no distension, no ascites and no mass. There is no hepatosplenomegaly. There is no tenderness. There is no rebound, no guarding and no CVA tenderness.  Musculoskeletal: Normal range of motion. He exhibits no edema and no tenderness.  Lymphadenopathy:    He has no cervical adenopathy.  Neurological: He is alert and oriented to person, place, and time. He has normal  strength. No cranial nerve deficit or sensory deficit. He exhibits normal muscle tone. GCS eye subscore is 4. GCS verbal subscore is 5. GCS motor subscore is 6.  Normal strength and sensation x 4 extremities.  Normal cerebellar testing  Skin: Skin is warm and intact. No petechiae and no rash noted. He is diaphoretic. No erythema. No pallor.  Psychiatric: He has a normal mood and affect. His behavior is normal. Judgment normal.    ED Course  Procedures (including critical care time) Labs Review Labs Reviewed  COMPREHENSIVE METABOLIC PANEL - Abnormal; Notable for the following:    Glucose, Bld 123 (*)    BUN 25 (*)    Creatinine, Ser 1.98 (*)    Alkaline Phosphatase 188 (*)    GFR calc non Af Amer 41 (*)    GFR calc Af Amer 47 (*)    All other components within normal limits  CBC  TROPONIN I    Imaging Review Dg Chest Port 1 View  03/09/2014   CLINICAL DATA:  Sudden onset of chest pain.  EXAM: PORTABLE CHEST - 1 VIEW  COMPARISON:  None.  FINDINGS: The lungs are well-aerated. Mild vascular congestion is noted. Minimal bibasilar atelectasis is seen. There is no evidence of pleural effusion or pneumothorax.  The cardiomediastinal silhouette is within normal limits. No acute osseous abnormalities are seen.  IMPRESSION: Mild vascular congestion noted.  Minimal bibasilar atelectasis seen.   Electronically Signed   By: Roanna Raider M.D.   On: 03/09/2014 04:20   Ct Angio Chest Aorta W/cm &/or Wo/cm  03/09/2014   CLINICAL DATA:  CHEST PAIN, LEFT ARM NUMBNESS. EVALUATE FOR DISSECTION.  EXAM: CT ANGIOGRAPHY CHEST, ABDOMEN AND PELVIS  TECHNIQUE: Multidetector CT imaging through the chest, abdomen and pelvis was performed using the standard protocol during bolus administration of intravenous contrast. Multiplanar reconstructed images and MIPs were obtained and reviewed to evaluate the vascular anatomy.  CONTRAST:  80mL OMNIPAQUE IOHEXOL 350 MG/ML SOLN  COMPARISON:  Prior radiograph from earlier the  same day.  FINDINGS: CTA CHEST FINDINGS  Thyroid gland is unremarkable. No mediastinal, hilar, or axillary adenopathy is identified.  Evaluation of the intrathoracic aorta somewhat limited due to timing of the contrast bolus. The intrathoracic aorta is of normal caliber without evidence of dissection or other acute abnormality. No aneurysm. Great vessels are normal.  Heart size is within normal limits. No pericardial effusion. Pulmonary arteries are unremarkable.  Lungs are clear without focal infiltrate, pulmonary edema, or pleural effusion. No worrisome pulmonary nodule or mass.  No acute osseous abnormality identified within the thorax.  Review of the MIP images confirms the above findings.  CTA ABDOMEN AND PELVIS FINDINGS  Scattered calcifications noted within the liver. The liver is otherwise unremarkable. Gallbladder within normal limits. No biliary dilatation. The spleen, adrenal glands, and pancreas demonstrate a normal contrast  enhanced appearance.  Kidneys are equal in size with symmetric enhancement. No nephrolithiasis, hydronephrosis, or focal enhancing renal mass.  Stomach is mildly distended with fluid to present within the gastric lumen. No evidence of obstruction. Appendix is normal. No inflammatory changes seen about the bowels.  Bladder are unremarkable.  Prostate within normal limits.  Small right inguinal hernia with fluid present within the right scrotal sac noted.  No free air or fluid.  Normal intravascular enhancement seen throughout the intra-abdominal aorta are without evidence of dissection or other acute abnormality. No intra-abdominal aneurysm. Mild atheromatous plaque present within the proximal common iliac arteries bilaterally, left greater than right.  No acute osseous abnormality within the abdomen and pelvis. Prominent bulky osteophytic spurring seen within the thoracic and lumbar spine. Degenerative changes noted about the hips bilaterally, left greater than right.  Review of the  MIP images confirms the above findings.  IMPRESSION: 1. No CTA evidence of aortic dissection or other acute aortic abnormality. No aneurysm. 2. No other acute abnormality within the chest, abdomen, or pelvis.   Electronically Signed   By: Rise Mu M.D.   On: 03/09/2014 05:33   Ct Angio Abd/pel W/ And/or W/o  03/09/2014   CLINICAL DATA:  CHEST PAIN, LEFT ARM NUMBNESS. EVALUATE FOR DISSECTION.  EXAM: CT ANGIOGRAPHY CHEST, ABDOMEN AND PELVIS  TECHNIQUE: Multidetector CT imaging through the chest, abdomen and pelvis was performed using the standard protocol during bolus administration of intravenous contrast. Multiplanar reconstructed images and MIPs were obtained and reviewed to evaluate the vascular anatomy.  CONTRAST:  80mL OMNIPAQUE IOHEXOL 350 MG/ML SOLN  COMPARISON:  Prior radiograph from earlier the same day.  FINDINGS: CTA CHEST FINDINGS  Thyroid gland is unremarkable. No mediastinal, hilar, or axillary adenopathy is identified.  Evaluation of the intrathoracic aorta somewhat limited due to timing of the contrast bolus. The intrathoracic aorta is of normal caliber without evidence of dissection or other acute abnormality. No aneurysm. Great vessels are normal.  Heart size is within normal limits. No pericardial effusion. Pulmonary arteries are unremarkable.  Lungs are clear without focal infiltrate, pulmonary edema, or pleural effusion. No worrisome pulmonary nodule or mass.  No acute osseous abnormality identified within the thorax.  Review of the MIP images confirms the above findings.  CTA ABDOMEN AND PELVIS FINDINGS  Scattered calcifications noted within the liver. The liver is otherwise unremarkable. Gallbladder within normal limits. No biliary dilatation. The spleen, adrenal glands, and pancreas demonstrate a normal contrast enhanced appearance.  Kidneys are equal in size with symmetric enhancement. No nephrolithiasis, hydronephrosis, or focal enhancing renal mass.  Stomach is mildly  distended with fluid to present within the gastric lumen. No evidence of obstruction. Appendix is normal. No inflammatory changes seen about the bowels.  Bladder are unremarkable.  Prostate within normal limits.  Small right inguinal hernia with fluid present within the right scrotal sac noted.  No free air or fluid.  Normal intravascular enhancement seen throughout the intra-abdominal aorta are without evidence of dissection or other acute abnormality. No intra-abdominal aneurysm. Mild atheromatous plaque present within the proximal common iliac arteries bilaterally, left greater than right.  No acute osseous abnormality within the abdomen and pelvis. Prominent bulky osteophytic spurring seen within the thoracic and lumbar spine. Degenerative changes noted about the hips bilaterally, left greater than right.  Review of the MIP images confirms the above findings.  IMPRESSION: 1. No CTA evidence of aortic dissection or other acute aortic abnormality. No aneurysm. 2. No other acute abnormality within  the chest, abdomen, or pelvis.   Electronically Signed   By: Rise Mu M.D.   On: 03/09/2014 05:33     EKG Interpretation   Date/Time:  Monday March 09 2014 02:57:55 EDT Ventricular Rate:  85 PR Interval:  139 QRS Duration: 87 QT Interval:  356 QTC Calculation: 423 R Axis:   70 Text Interpretation:  Sinus rhythm Left ventricular hypertrophy No  significant change since last tracing Confirmed by Erroll Luna  [phone]) on 03/09/2014 5:03:26 PM      MDM   Final diagnoses:  Chest pain, unspecified chest pain type  Acute kidney injury    Patient presents with signs concerning for dissection.  He has chest pain, radiating to the back, with neurological findings.  CT scan negative for any aortic injuries.  EKG is non ischemic and unchanged from previous.  He was given aspirin in triage.  I ordered 2L IVF for AKI and SBP in the low 100s.  Looking back in his chart his SBP is always  low.  He has one prior elevated Cr, today it is 1.9.  He was told of this and instructed to follow up regarding this early this week.  He was given PCP resources.  Patients vital signs remain within his normal limits and he is safe for discharge.    Tomasita Crumble, MD 03/09/14 [phone]

## 2014-03-09 NOTE — ED Notes (Signed)
Patient is having chest pain and tingle and numbness in the arm on the left side. Patient stated he has chest pain on and off for two weeks.

## 2014-03-10 LAB — COMPREHENSIVE METABOLIC PANEL
ALBUMIN: 4.1 g/dL (ref 3.5–5.2)
ALT: 24 U/L (ref 0–53)
ANION GAP: 12 (ref 5–15)
AST: 34 U/L (ref 0–37)
Alkaline Phosphatase: 190 U/L — ABNORMAL HIGH (ref 39–117)
BILIRUBIN TOTAL: 0.5 mg/dL (ref 0.3–1.2)
BUN: 26 mg/dL — AB (ref 6–23)
CO2: 24 mEq/L (ref 19–32)
CREATININE: 0.83 mg/dL (ref 0.50–1.35)
Calcium: 9.6 mg/dL (ref 8.4–10.5)
Chloride: 100 mEq/L (ref 96–112)
GFR calc non Af Amer: >90 mL/min (ref >90)
GLUCOSE: 135 mg/dL — AB (ref 70–99)
Potassium: 4.3 mEq/L (ref 3.7–5.3)
Sodium: 136 mEq/L — ABNORMAL LOW (ref 137–147)
Total Protein: 7.4 g/dL (ref 6.0–8.3)

## 2014-03-10 LAB — RAPID URINE DRUG SCREEN, HOSP PERFORMED
Amphetamines: NOT DETECTED
Barbiturates: NOT DETECTED
Benzodiazepines: NOT DETECTED
COCAINE: NOT DETECTED
Opiates: POSITIVE — AB
TETRAHYDROCANNABINOL: NOT DETECTED

## 2014-03-10 LAB — ETHANOL

## 2014-03-10 LAB — SALICYLATE LEVEL: Salicylate Lvl: <2 mg/dL — ABNORMAL LOW (ref 2.8–20.0)

## 2014-03-10 LAB — ACETAMINOPHEN LEVEL: Acetaminophen (Tylenol), Serum: <15 ug/mL (ref 10–30)

## 2014-03-10 NOTE — Discharge Instructions (Signed)
Substance Abuse Treatment Programs ° °Intensive Outpatient Programs °[facility]     °[address]      °[place], [place]                   °[phone]      ° °[facility] °[address] °[place], [place] °[phone] ° °[facility]     °(Inpatient and outpatient)     °[address]           °[phone]   ° °[facility] °[phone] (Suboxone and Methadone) ° °[address]      °[place], [place] [address]      °[phone]      ° °[address] °[place], [place] °[phone] ° °[facility] (Outpatient/Inpatient, Chemical)    °(insurance only) [phone]      °       °[facility] (Groups & Residential) °[place], [place] °[phone] ° °   °[facility]     °[address]     °[place], [place]      °[phone]      ° °[facility] (for caregivers and family) °[address] °[place], [place] °336-299-4655 ° ° ° ° ° °Residential Treatment Programs °[facility]      °[address], [place], [place] [address]  °[phone]      ° °[facility] °[address] [place], [place] [address] °[phone] ° °[facility]        °[phone]      ° °[facility] °[phone] ° °[facility]             °[address]                                         °[place], [place]                                                °[phone] or [phone]                              ° °[facility] [place] °[address] °[place], [address] °[phone] ° °[facility]    °[address]      °[place], [place]     °[phone]      ° °[facility] °[address] °[place], [place] °[phone] ° °[facility]   °[address]     °[place], [place] [address]     °[phone]      °Admissions: 8am-3pm M-F ° °[facility] °[address] °[place] °336-227-7417 ° °[facility]: Residential Program (90 Days)   °[place], [place]      °[phone] or [phone]    ° °[facility] °[place], [place] °(Walk in Hours over the weekend or by referral) ° °[facility] °[address], [place], [place] [address] °([phone] ° °Crisis Mobile: [facility]:  1-877-626-1772 (for crisis response 24 hours a day) °[facility] Hotline:      1-800-256-2452 °Outpatient Psychiatry and Counseling ° °[facility]: Mobile Crisis   Management 24 hours:  1-877-626-1772 ° °[facility] sliding scale fee and walk in schedule: M-F 8am-12pm/1pm-3pm °[address]  °[place], [place] [address] °[phone] ° °[facility] °[address] °[place], [place] [address] °[phone] ° °[facility] ([facility])- new patient walk-in appointments available Monday - Friday 8am -3pm.          °[address] °[place], [place] [address] °336-676-6840 or crisis line- 336-676-6905 ° °[facility]/ Intensive Outpatient Therapy Program °[address] °[place], [place] [address] °336-832-9804 ° °[facility]                  °Crisis Services      °[phone]      °[address]     °[place], [place] [address]                ° °[facility]   °[facility] °[phone] °[address] °[place], [place] [address] ° ° °[facility]          °[address]  °[place], [place] [address]       °[phone] ° °[facility] °[address] °[place], [place] [address] °336-292-1510 ° °[facility]    °[address]    °[place], [place] [address]     °[phone]      ° °Parish McKinney, MD     °[address]     °[place] [address]     °[phone]     °  °[facility] °[address] [place]  [address] °[place] [address] ° °[facility]     °[address]     °[place], [place]      °[phone]      ° °[facility] °Shamsher Ahluwalia, MD °[address] °[place], [place] [address] °[phone] ° °[facility]     °[address]     °[place], [place] [address]     °336-274-1237      ° °[facility] °[address] °[place], [place] [address] °336-854-4450 °Resources for Temporary Residential Assistance/Crisis Centers ° °DAY CENTERS °[facility] °M-F 8am-3pm   °[address] [place], [place] [address]   336-332-0824 °Services include: laundry, barbering, support groups, case management, phone  & computer access, showers, AA/NA mtgs, mental health/substance abuse nurse, job skills class, disability information, VA assistance, spiritual classes, etc.  ° °HOMELESS SHELTERS ° °[facility]     °[facility]   °[address], [place]     °[phone]       °       °[facility] (women and children)       °[address] °[place], [place] [address] °336-275-0820 °[email or web address] for application and process °Application Required ° °[facility]   °[address]    °[place] [address]     °[phone]       °             °[facility] °[address] °[place], [place] [address] °[phone] °336-235-0363(schedule application appt.) °Application Required ° °[facility] (women only)    °[address]     °[place], [place] [address]     °336-884-1039      °  Intake starts 6pm daily °Need valid ID, SSC, & Police report °[facility] [place] °[address] °[place], [place] °336-881-5420 °Application Required ° °[facility] (men only)     °[address]      °[place], [place]     °[phone]      ° °[facility] °(Pregnant women only) °[address] °[place], [place] °336-275-0206 ° °[facility]      °[address]      °[place], [place] [address]     °[phone]      °       °[facility] °[address] °[place], [place] °336-723-1848 °90 day commitment/SA/Application process ° °[facility](men only)     °[address]     °[place], [place]     °[phone]       °Check-in at 7pm     °       °[facility] °[address] °[place], [place] [address] °336-248-6684 °Men/Women/Women and Children must be there by 7 pm ° °[facility] °[place],  °336-722-8721                ° °

## 2014-03-10 NOTE — ED Provider Notes (Signed)
CSN: [id number]     Arrival date & time 03/09/14  2206 History   First MD Initiated Contact with Patient 03/10/14 [phone]     Chief Complaint  Patient presents with  . Addiction Problem     (Consider location/radiation/quality/duration/timing/severity/associated sxs/prior Treatment) HPI Comments: Patient here requesting help with heroin use. He also drinks occasional alcohol. Denies any suicidal or homicidal ideations. Denies any tremors. She states that he snorts heroin. Was seen here recently for chest discomfort and had a workup including a CT of his chest all of which came back normal. Patient has never been in detox before. No treatment used for this prior to arrival. His last drink was several days ago. Denies any command hallucinations.  The history is provided by the patient.    Past Medical History  Diagnosis Date  . Chronic back pain   . Bipolar disorder   . Anxiety   . Alcoholism /alcohol abuse   . Heroin abuse    Past Surgical History  Procedure Laterality Date  . Hernia repair     Family History  Problem Relation Age of Onset  . Hypertension Other    History  Substance Use Topics  . Smoking status: Current Every Day Smoker -- 1.00 packs/day    Types: Cigarettes  . Smokeless tobacco: Never Used  . Alcohol Use: Yes    Review of Systems  All other systems reviewed and are negative.     Allergies  Review of patient's allergies indicates no known allergies.  Home Medications   Prior to Admission medications   Medication Sig Start Date End Date Taking? Authorizing Provider  Cyanocobalamin (VITAMIN B-12 CR PO) Take 1 tablet by mouth daily.   Yes Historical Provider, MD  FLUoxetine (PROZAC) 20 MG tablet Take 20 mg by mouth daily.   Yes Historical Provider, MD  ibuprofen (ADVIL,MOTRIN) 200 MG tablet Take 1,000 mg by mouth every 6 (six) hours as needed for moderate pain.   Yes Historical Provider, MD  oxyCODONE-acetaminophen (PERCOCET) 10-325 MG per tablet Take  1 tablet by mouth every 8 (eight) hours as needed for pain.   Yes Historical Provider, MD  traZODone (DESYREL) 100 MG tablet Take 1 tablet (100 mg total) by mouth at bedtime as needed for sleep. 01/26/14  Yes Neil Mashburn, PA-C   BP 132/77  Pulse 80  Temp(Src) 98.6 F (37 C) (Oral)  Resp 18  SpO2 96% Physical Exam  Nursing note and vitals reviewed. Constitutional: He is oriented to person, place, and time. He appears well-developed and well-nourished.  Non-toxic appearance. No distress.  HENT:  Head: Normocephalic and atraumatic.  Eyes: Conjunctivae, EOM and lids are normal. Pupils are equal, round, and reactive to light.  Neck: Normal range of motion. Neck supple. No tracheal deviation present. No mass present.  Cardiovascular: Normal rate, regular rhythm and normal heart sounds.  Exam reveals no gallop.   No murmur heard. Pulmonary/Chest: Effort normal and breath sounds normal. No stridor. No respiratory distress. He has no decreased breath sounds. He has no wheezes. He has no rhonchi. He has no rales.  Abdominal: Soft. Normal appearance and bowel sounds are normal. He exhibits no distension. There is no tenderness. There is no rebound and no CVA tenderness.  Musculoskeletal: Normal range of motion. He exhibits no edema and no tenderness.  Neurological: He is alert and oriented to person, place, and time. He has normal strength. No cranial nerve deficit or sensory deficit. GCS eye subscore is 4. GCS verbal subscore is  5. GCS motor subscore is 6.  Skin: Skin is warm and dry. No abrasion and no rash noted.  Psychiatric: He has a normal mood and affect. His speech is normal and behavior is normal. He expresses no suicidal plans and no homicidal plans.    ED Course  Procedures (including critical care time) Labs Review Labs Reviewed  COMPREHENSIVE METABOLIC PANEL - Abnormal; Notable for the following:    Sodium 136 (*)    Glucose, Bld 135 (*)    BUN 26 (*)    Alkaline Phosphatase 190  (*)    All other components within normal limits  SALICYLATE LEVEL - Abnormal; Notable for the following:    Salicylate Lvl <2.0 (*)    All other components within normal limits  URINE RAPID DRUG SCREEN (HOSP PERFORMED) - Abnormal; Notable for the following:    Opiates POSITIVE (*)    All other components within normal limits  ACETAMINOPHEN LEVEL  CBC  ETHANOL    Imaging Review Dg Chest Port 1 View  03/09/2014   CLINICAL DATA:  Sudden onset of chest pain.  EXAM: PORTABLE CHEST - 1 VIEW  COMPARISON:  None.  FINDINGS: The lungs are well-aerated. Mild vascular congestion is noted. Minimal bibasilar atelectasis is seen. There is no evidence of pleural effusion or pneumothorax.  The cardiomediastinal silhouette is within normal limits. No acute osseous abnormalities are seen.  IMPRESSION: Mild vascular congestion noted.  Minimal bibasilar atelectasis seen.   Electronically Signed   By: Roanna Raider M.D.   On: 03/09/2014 04:20   Ct Angio Chest Aorta W/cm &/or Wo/cm  03/09/2014   CLINICAL DATA:  CHEST PAIN, LEFT ARM NUMBNESS. EVALUATE FOR DISSECTION.  EXAM: CT ANGIOGRAPHY CHEST, ABDOMEN AND PELVIS  TECHNIQUE: Multidetector CT imaging through the chest, abdomen and pelvis was performed using the standard protocol during bolus administration of intravenous contrast. Multiplanar reconstructed images and MIPs were obtained and reviewed to evaluate the vascular anatomy.  CONTRAST:  80mL OMNIPAQUE IOHEXOL 350 MG/ML SOLN  COMPARISON:  Prior radiograph from earlier the same day.  FINDINGS: CTA CHEST FINDINGS  Thyroid gland is unremarkable. No mediastinal, hilar, or axillary adenopathy is identified.  Evaluation of the intrathoracic aorta somewhat limited due to timing of the contrast bolus. The intrathoracic aorta is of normal caliber without evidence of dissection or other acute abnormality. No aneurysm. Great vessels are normal.  Heart size is within normal limits. No pericardial effusion. Pulmonary arteries  are unremarkable.  Lungs are clear without focal infiltrate, pulmonary edema, or pleural effusion. No worrisome pulmonary nodule or mass.  No acute osseous abnormality identified within the thorax.  Review of the MIP images confirms the above findings.  CTA ABDOMEN AND PELVIS FINDINGS  Scattered calcifications noted within the liver. The liver is otherwise unremarkable. Gallbladder within normal limits. No biliary dilatation. The spleen, adrenal glands, and pancreas demonstrate a normal contrast enhanced appearance.  Kidneys are equal in size with symmetric enhancement. No nephrolithiasis, hydronephrosis, or focal enhancing renal mass.  Stomach is mildly distended with fluid to present within the gastric lumen. No evidence of obstruction. Appendix is normal. No inflammatory changes seen about the bowels.  Bladder are unremarkable.  Prostate within normal limits.  Small right inguinal hernia with fluid present within the right scrotal sac noted.  No free air or fluid.  Normal intravascular enhancement seen throughout the intra-abdominal aorta are without evidence of dissection or other acute abnormality. No intra-abdominal aneurysm. Mild atheromatous plaque present within the proximal common iliac arteries  bilaterally, left greater than right.  No acute osseous abnormality within the abdomen and pelvis. Prominent bulky osteophytic spurring seen within the thoracic and lumbar spine. Degenerative changes noted about the hips bilaterally, left greater than right.  Review of the MIP images confirms the above findings.  IMPRESSION: 1. No CTA evidence of aortic dissection or other acute aortic abnormality. No aneurysm. 2. No other acute abnormality within the chest, abdomen, or pelvis.   Electronically Signed   By: Rise Mu M.D.   On: 03/09/2014 05:33   Ct Angio Abd/pel W/ And/or W/o  03/09/2014   CLINICAL DATA:  CHEST PAIN, LEFT ARM NUMBNESS. EVALUATE FOR DISSECTION.  EXAM: CT ANGIOGRAPHY CHEST, ABDOMEN AND  PELVIS  TECHNIQUE: Multidetector CT imaging through the chest, abdomen and pelvis was performed using the standard protocol during bolus administration of intravenous contrast. Multiplanar reconstructed images and MIPs were obtained and reviewed to evaluate the vascular anatomy.  CONTRAST:  80mL OMNIPAQUE IOHEXOL 350 MG/ML SOLN  COMPARISON:  Prior radiograph from earlier the same day.  FINDINGS: CTA CHEST FINDINGS  Thyroid gland is unremarkable. No mediastinal, hilar, or axillary adenopathy is identified.  Evaluation of the intrathoracic aorta somewhat limited due to timing of the contrast bolus. The intrathoracic aorta is of normal caliber without evidence of dissection or other acute abnormality. No aneurysm. Great vessels are normal.  Heart size is within normal limits. No pericardial effusion. Pulmonary arteries are unremarkable.  Lungs are clear without focal infiltrate, pulmonary edema, or pleural effusion. No worrisome pulmonary nodule or mass.  No acute osseous abnormality identified within the thorax.  Review of the MIP images confirms the above findings.  CTA ABDOMEN AND PELVIS FINDINGS  Scattered calcifications noted within the liver. The liver is otherwise unremarkable. Gallbladder within normal limits. No biliary dilatation. The spleen, adrenal glands, and pancreas demonstrate a normal contrast enhanced appearance.  Kidneys are equal in size with symmetric enhancement. No nephrolithiasis, hydronephrosis, or focal enhancing renal mass.  Stomach is mildly distended with fluid to present within the gastric lumen. No evidence of obstruction. Appendix is normal. No inflammatory changes seen about the bowels.  Bladder are unremarkable.  Prostate within normal limits.  Small right inguinal hernia with fluid present within the right scrotal sac noted.  No free air or fluid.  Normal intravascular enhancement seen throughout the intra-abdominal aorta are without evidence of dissection or other acute abnormality.  No intra-abdominal aneurysm. Mild atheromatous plaque present within the proximal common iliac arteries bilaterally, left greater than right.  No acute osseous abnormality within the abdomen and pelvis. Prominent bulky osteophytic spurring seen within the thoracic and lumbar spine. Degenerative changes noted about the hips bilaterally, left greater than right.  Review of the MIP images confirms the above findings.  IMPRESSION: 1. No CTA evidence of aortic dissection or other acute aortic abnormality. No aneurysm. 2. No other acute abnormality within the chest, abdomen, or pelvis.   Electronically Signed   By: Rise Mu M.D.   On: 03/09/2014 05:33     EKG Interpretation None      MDM   Final diagnoses:  Uncomplicated opioid dependence    Patient without evidence of clinical DTs at this time. No suicidal homicidal ideations. He has been given resources.    Toy Baker, MD 03/10/14 [phone]

## 2014-03-10 NOTE — ED Notes (Signed)
Pt requesting Detox from Herion and Cocaine; denies SI / HI

## 2014-03-12 DIAGNOSIS — Z79899 Other long term (current) drug therapy: Secondary | ICD-10-CM | POA: Insufficient documentation

## 2014-03-12 DIAGNOSIS — G8929 Other chronic pain: Secondary | ICD-10-CM | POA: Insufficient documentation

## 2014-03-12 DIAGNOSIS — F319 Bipolar disorder, unspecified: Secondary | ICD-10-CM | POA: Insufficient documentation

## 2014-03-12 DIAGNOSIS — F17200 Nicotine dependence, unspecified, uncomplicated: Secondary | ICD-10-CM | POA: Insufficient documentation

## 2014-03-12 DIAGNOSIS — F411 Generalized anxiety disorder: Secondary | ICD-10-CM | POA: Insufficient documentation

## 2014-03-12 DIAGNOSIS — F1410 Cocaine abuse, uncomplicated: Secondary | ICD-10-CM | POA: Insufficient documentation

## 2014-03-12 DIAGNOSIS — F1110 Opioid abuse, uncomplicated: Secondary | ICD-10-CM | POA: Insufficient documentation

## 2014-03-12 DIAGNOSIS — R45851 Suicidal ideations: Secondary | ICD-10-CM | POA: Insufficient documentation

## 2014-03-12 DIAGNOSIS — F1021 Alcohol dependence, in remission: Secondary | ICD-10-CM | POA: Insufficient documentation

## 2014-03-13 ENCOUNTER — Emergency Department (HOSPITAL_COMMUNITY)
Admission: EM | Admit: 2014-03-13 | Discharge: 2014-03-14 | Disposition: A | Discharge status: Other Acute Inpatient Hospital | Payer: Self-pay | Attending: Emergency Medicine | Admitting: Emergency Medicine

## 2014-03-13 ENCOUNTER — Encounter (HOSPITAL_COMMUNITY): Payer: Self-pay | Admitting: Emergency Medicine

## 2014-03-13 DIAGNOSIS — R4689 Other symptoms and signs involving appearance and behavior: Secondary | ICD-10-CM

## 2014-03-13 DIAGNOSIS — R4589 Other symptoms and signs involving emotional state: Secondary | ICD-10-CM

## 2014-03-13 DIAGNOSIS — R45851 Suicidal ideations: Secondary | ICD-10-CM

## 2014-03-13 DIAGNOSIS — T1491XA Suicide attempt, initial encounter: Secondary | ICD-10-CM

## 2014-03-13 DIAGNOSIS — F1910 Other psychoactive substance abuse, uncomplicated: Secondary | ICD-10-CM

## 2014-03-13 DIAGNOSIS — F332 Major depressive disorder, recurrent severe without psychotic features: Secondary | ICD-10-CM

## 2014-03-13 DIAGNOSIS — F4310 Post-traumatic stress disorder, unspecified: Secondary | ICD-10-CM

## 2014-03-13 LAB — RAPID URINE DRUG SCREEN, HOSP PERFORMED
AMPHETAMINES: NOT DETECTED
BENZODIAZEPINES: NOT DETECTED
Barbiturates: NOT DETECTED
Cocaine: POSITIVE — AB
OPIATES: POSITIVE — AB
Tetrahydrocannabinol: NOT DETECTED

## 2014-03-13 LAB — CBC
HCT: 39.5 % (ref 39.0–52.0)
HEMOGLOBIN: 13.9 g/dL (ref 13.0–17.0)
MCH: 29.4 pg (ref 26.0–34.0)
MCHC: 35.2 g/dL (ref 30.0–36.0)
MCV: 83.7 fL (ref 78.0–100.0)
PLATELETS: 304 10*3/uL (ref 150–400)
RBC: 4.72 MIL/uL (ref 4.22–5.81)
RDW: 12.7 % (ref 11.5–15.5)
WBC: 8.6 10*3/uL (ref 4.0–10.5)

## 2014-03-13 LAB — COMPREHENSIVE METABOLIC PANEL
ALT: 19 U/L (ref 0–53)
AST: 12 U/L (ref 0–37)
Albumin: 3.6 g/dL (ref 3.5–5.2)
Alkaline Phosphatase: 151 U/L — ABNORMAL HIGH (ref 39–117)
Anion gap: 12 (ref 5–15)
BUN: 9 mg/dL (ref 6–23)
CO2: 26 meq/L (ref 19–32)
Calcium: 9.4 mg/dL (ref 8.4–10.5)
Chloride: 101 mEq/L (ref 96–112)
Creatinine, Ser: 0.63 mg/dL (ref 0.50–1.35)
GFR calc Af Amer: >90 mL/min (ref >90)
GFR calc non Af Amer: >90 mL/min (ref >90)
Glucose, Bld: 98 mg/dL (ref 70–99)
Potassium: 3.6 mEq/L — ABNORMAL LOW (ref 3.7–5.3)
SODIUM: 139 meq/L (ref 137–147)
TOTAL PROTEIN: 7.3 g/dL (ref 6.0–8.3)
Total Bilirubin: 0.3 mg/dL (ref 0.3–1.2)

## 2014-03-13 LAB — SALICYLATE LEVEL

## 2014-03-13 LAB — ETHANOL: Alcohol, Ethyl (B): <11 mg/dL (ref 0–11)

## 2014-03-13 LAB — ACETAMINOPHEN LEVEL: Acetaminophen (Tylenol), Serum: <15 ug/mL (ref 10–30)

## 2014-03-13 MED ORDER — LORAZEPAM 1 MG PO TABS
1.0000 mg | ORAL_TABLET | Freq: Once | ORAL | Status: AC
  Administered 2014-03-13: 1 mg via ORAL
  Filled 2014-03-13: qty 1

## 2014-03-13 MED ORDER — IBUPROFEN 200 MG PO TABS
600.0000 mg | ORAL_TABLET | Freq: Three times a day (TID) | ORAL | Status: DC | PRN
  Administered 2014-03-13: 600 mg via ORAL
  Filled 2014-03-13: qty 3

## 2014-03-13 MED ORDER — HYDROXYZINE HCL 25 MG PO TABS
25.0000 mg | ORAL_TABLET | Freq: Three times a day (TID) | ORAL | Status: DC | PRN
  Administered 2014-03-13: 25 mg via ORAL
  Filled 2014-03-13: qty 1

## 2014-03-13 MED ORDER — ACETAMINOPHEN 325 MG PO TABS: 650.0000 mg | ORAL_TABLET | ORAL | Status: DC | PRN

## 2014-03-13 MED ORDER — TRAZODONE HCL 100 MG PO TABS
100.0000 mg | ORAL_TABLET | Freq: Every evening | ORAL | Status: DC | PRN
  Administered 2014-03-13: 100 mg via ORAL
  Filled 2014-03-13: qty 1

## 2014-03-13 MED ORDER — ALUM & MAG HYDROXIDE-SIMETH 200-200-20 MG/5ML PO SUSP: 30.0000 mL | ORAL | Status: DC | PRN

## 2014-03-13 MED ORDER — ZOLPIDEM TARTRATE 5 MG PO TABS: 5.0000 mg | ORAL_TABLET | Freq: Every evening | ORAL | Status: DC | PRN

## 2014-03-13 MED ORDER — NICOTINE 21 MG/24HR TD PT24
21.0000 mg | MEDICATED_PATCH | Freq: Every day | TRANSDERMAL | Status: DC
  Administered 2014-03-13: 21 mg via TRANSDERMAL
  Filled 2014-03-13: qty 1

## 2014-03-13 MED ORDER — LORAZEPAM 1 MG PO TABS
1.0000 mg | ORAL_TABLET | Freq: Three times a day (TID) | ORAL | Status: DC | PRN
  Administered 2014-03-13: 1 mg via ORAL
  Filled 2014-03-13: qty 1

## 2014-03-13 MED ORDER — IBUPROFEN 800 MG PO TABS
800.0000 mg | ORAL_TABLET | Freq: Once | ORAL | Status: AC
Start: 2014-03-13 — End: 2014-03-13
  Administered 2014-03-13: 800 mg via ORAL
  Filled 2014-03-13 (×2): qty 1

## 2014-03-13 MED ORDER — FLUOXETINE HCL 20 MG PO CAPS
20.0000 mg | ORAL_CAPSULE | Freq: Every day | ORAL | Status: DC
  Administered 2014-03-13: 20 mg via ORAL
  Filled 2014-03-13 (×2): qty 1

## 2014-03-13 MED ORDER — ONDANSETRON HCL 4 MG PO TABS: 4.0000 mg | ORAL_TABLET | Freq: Three times a day (TID) | ORAL | Status: DC | PRN
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
## 2014-03-13 NOTE — ED Notes (Signed)
Pt has been wanded by security. 
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
## 2014-03-13 NOTE — BH Assessment (Signed)
Assessment completed. Psychiatric evaluation is recommended. Marlon Pel, PA-C has been notified of the disposition.
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
## 2014-03-13 NOTE — Progress Notes (Signed)
[facility] [employer],   Provided pt with a [employer] application to help patient establish primary care. Also, provided pt with information on [employer] of the [place] for outpatient mental health resources.

## 2014-03-13 NOTE — ED Provider Notes (Signed)
Medical screening examination/treatment/procedure(s) were performed by non-physician practitioner and as supervising physician I was immediately available for consultation/collaboration.   EKG Interpretation None       Frankie Scipio, MD 03/13/14 0818 

## 2014-03-13 NOTE — ED Notes (Signed)
Report to [facility] RN-ready for pt

## 2014-03-13 NOTE — ED Notes (Signed)
Patient endorses SI. Contracts for safety on the unit. Denies HI, AVH. Reports feelings of anxiety 4/10, depression 9/10. States sleep is poor, having trouble falling and staying asleep. Reports decreased appetite in last three weeks with recent weight loss of 15 lbs.  Encouragement offered. Oriented to the unit.  Q 15 safety checks in place.

## 2014-03-13 NOTE — Progress Notes (Signed)
CSW received a call from Calvin with [facility] accepting this patient for admission per Dr. Pucilowska. Report# [phone]. Please send patient for admission after 9pm. CSW spoke with Eric, RN to update him with the current disposition and plans for discharge   Novah Goza, MSW, LCSWA, 03/13/2014 Evening Clinical Social Worker [phone] 
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
## 2014-03-14 ENCOUNTER — Inpatient Hospital Stay: Payer: Self-pay | Admitting: Psychiatry

## 2014-03-14 NOTE — ED Notes (Signed)
Patient transferred to [facility] in no acute distress. Left with all belongings. Discharge instructions reviewed with patient. Patient verbalized understanding. Billy Coast, RN

## 2014-05-24 ENCOUNTER — Emergency Department (HOSPITAL_COMMUNITY): Payer: Self-pay

## 2014-05-24 ENCOUNTER — Encounter (HOSPITAL_COMMUNITY): Payer: Self-pay | Admitting: *Deleted

## 2014-05-24 ENCOUNTER — Emergency Department (HOSPITAL_COMMUNITY)
Admission: EM | Admit: 2014-05-24 | Discharge: 2014-05-25 | Disposition: A | Discharge status: Other Acute Inpatient Hospital | Payer: Self-pay | Attending: Emergency Medicine | Admitting: Emergency Medicine

## 2014-05-24 DIAGNOSIS — F3131 Bipolar disorder, current episode depressed, mild: Secondary | ICD-10-CM | POA: Insufficient documentation

## 2014-05-24 DIAGNOSIS — R45851 Suicidal ideations: Secondary | ICD-10-CM

## 2014-05-24 DIAGNOSIS — Z79899 Other long term (current) drug therapy: Secondary | ICD-10-CM | POA: Insufficient documentation

## 2014-05-24 DIAGNOSIS — F419 Anxiety disorder, unspecified: Secondary | ICD-10-CM | POA: Insufficient documentation

## 2014-05-24 DIAGNOSIS — R109 Unspecified abdominal pain: Secondary | ICD-10-CM

## 2014-05-24 DIAGNOSIS — G8929 Other chronic pain: Secondary | ICD-10-CM | POA: Insufficient documentation

## 2014-05-24 DIAGNOSIS — Z720 Tobacco use: Secondary | ICD-10-CM | POA: Insufficient documentation

## 2014-05-24 HISTORY — DX: Suicide attempt, initial encounter: T14.91XA

## 2014-05-24 LAB — URINALYSIS, ROUTINE W REFLEX MICROSCOPIC
BILIRUBIN URINE: NEGATIVE
Glucose, UA: NEGATIVE mg/dL
Ketones, ur: NEGATIVE mg/dL
NITRITE: POSITIVE — AB
PH: 6 (ref 5.0–8.0)
Protein, ur: NEGATIVE mg/dL
SPECIFIC GRAVITY, URINE: 1.01 (ref 1.005–1.030)
Urobilinogen, UA: 0.2 mg/dL (ref 0.0–1.0)

## 2014-05-24 LAB — ACETAMINOPHEN LEVEL: Acetaminophen (Tylenol), Serum: <15 ug/mL (ref 10–30)

## 2014-05-24 LAB — CBC
HCT: 37.1 % — ABNORMAL LOW (ref 39.0–52.0)
Hemoglobin: 12.9 g/dL — ABNORMAL LOW (ref 13.0–17.0)
MCH: 28.5 pg (ref 26.0–34.0)
MCHC: 34.8 g/dL (ref 30.0–36.0)
MCV: 81.9 fL (ref 78.0–100.0)
Platelets: 338 10*3/uL (ref 150–400)
RBC: 4.53 MIL/uL (ref 4.22–5.81)
RDW: 13.2 % (ref 11.5–15.5)
WBC: 8.7 10*3/uL (ref 4.0–10.5)

## 2014-05-24 LAB — URINE MICROSCOPIC-ADD ON

## 2014-05-24 LAB — COMPREHENSIVE METABOLIC PANEL
ALBUMIN: 3.8 g/dL (ref 3.5–5.2)
ALT: 13 U/L (ref 0–53)
ANION GAP: 14 (ref 5–15)
AST: 13 U/L (ref 0–37)
Alkaline Phosphatase: 199 U/L — ABNORMAL HIGH (ref 39–117)
BILIRUBIN TOTAL: 0.2 mg/dL — AB (ref 0.3–1.2)
BUN: 7 mg/dL (ref 6–23)
CO2: 24 mEq/L (ref 19–32)
Calcium: 9.5 mg/dL (ref 8.4–10.5)
Chloride: 99 mEq/L (ref 96–112)
Creatinine, Ser: 0.71 mg/dL (ref 0.50–1.35)
GFR calc Af Amer: >90 mL/min (ref >90)
GFR calc non Af Amer: >90 mL/min (ref >90)
Glucose, Bld: 108 mg/dL — ABNORMAL HIGH (ref 70–99)
Potassium: 3.6 mEq/L — ABNORMAL LOW (ref 3.7–5.3)
Sodium: 137 mEq/L (ref 137–147)
TOTAL PROTEIN: 7.2 g/dL (ref 6.0–8.3)

## 2014-05-24 LAB — RAPID URINE DRUG SCREEN, HOSP PERFORMED
Amphetamines: NOT DETECTED
Barbiturates: NOT DETECTED
Benzodiazepines: NOT DETECTED
Cocaine: NOT DETECTED
Opiates: NOT DETECTED
Tetrahydrocannabinol: NOT DETECTED

## 2014-05-24 LAB — ETHANOL: Alcohol, Ethyl (B): <11 mg/dL (ref 0–11)

## 2014-05-24 LAB — SALICYLATE LEVEL: Salicylate Lvl: <2 mg/dL — ABNORMAL LOW (ref 2.8–20.0)

## 2014-05-24 MED ORDER — TRAZODONE HCL 100 MG PO TABS: 100.0000 mg | ORAL_TABLET | Freq: Every evening | ORAL | Status: DC | PRN

## 2014-05-24 MED ORDER — LORAZEPAM 1 MG PO TABS
1.0000 mg | ORAL_TABLET | Freq: Three times a day (TID) | ORAL | Status: DC | PRN
  Administered 2014-05-24 – 2014-05-25 (×2): 1 mg via ORAL
  Filled 2014-05-24 (×2): qty 1

## 2014-05-24 MED ORDER — HYDROCODONE-ACETAMINOPHEN 5-325 MG PO TABS
1.0000 | ORAL_TABLET | Freq: Once | ORAL | Status: AC
  Administered 2014-05-24: 1 via ORAL
  Filled 2014-05-24: qty 1

## 2014-05-24 MED ORDER — NICOTINE 21 MG/24HR TD PT24
21.0000 mg | MEDICATED_PATCH | Freq: Once | TRANSDERMAL | Status: DC
  Administered 2014-05-24: 21 mg via TRANSDERMAL
  Filled 2014-05-24: qty 1

## 2014-05-24 MED ORDER — IBUPROFEN 400 MG PO TABS
600.0000 mg | ORAL_TABLET | Freq: Three times a day (TID) | ORAL | Status: DC | PRN
  Administered 2014-05-24 – 2014-05-25 (×2): 600 mg via ORAL
  Filled 2014-05-24 (×4): qty 1

## 2014-05-24 MED ORDER — ONDANSETRON HCL 4 MG PO TABS: 4.0000 mg | ORAL_TABLET | Freq: Three times a day (TID) | ORAL | Status: DC | PRN

## 2014-05-24 MED ORDER — QUETIAPINE FUMARATE 200 MG PO TABS
400.0000 mg | ORAL_TABLET | Freq: Every day | ORAL | Status: DC
  Administered 2014-05-24: 400 mg via ORAL
  Filled 2014-05-24: qty 2

## 2014-05-24 NOTE — ED Notes (Signed)
Dr Rhunette CroftNanavati aware of pt's u/a results. Advised he will order work up for kidney stone.
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
## 2014-05-24 NOTE — ED Notes (Addendum)
Called staffing office for sitter. Pt in paper scrubs. Pt belongings in bag at nurses station.

## 2014-05-24 NOTE — ED Notes (Signed)
Patient transported to CT 
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
## 2014-05-24 NOTE — ED Notes (Signed)
Sitter at bedside. Dr Rhunette CroftNanavati at bedside. Pt gives permission for staff to talk to his friend Bruce.

## 2014-05-24 NOTE — ED Notes (Signed)
TTS being performed.  

## 2014-05-24 NOTE — ED Notes (Signed)
[facility]ax[facility] Hire[facility]risten, [facility] aware [facility].

## 2014-05-25 ENCOUNTER — Encounter (HOSPITAL_COMMUNITY): Payer: Self-pay | Admitting: Behavioral Health

## 2014-05-25 ENCOUNTER — Inpatient Hospital Stay (HOSPITAL_COMMUNITY)
Admission: AD | Admit: 2014-05-25 | Discharge: 2014-06-01 | DRG: 885 | Disposition: A | Discharge status: Home / Self Care | Payer: Federal, State, Local not specified - Other | Source: Intra-hospital | Attending: Psychiatry | Admitting: Psychiatry

## 2014-05-25 DIAGNOSIS — F3130 Bipolar disorder, current episode depressed, mild or moderate severity, unspecified: Secondary | ICD-10-CM | POA: Diagnosis present

## 2014-05-25 DIAGNOSIS — F1120 Opioid dependence, uncomplicated: Secondary | ICD-10-CM | POA: Diagnosis present

## 2014-05-25 DIAGNOSIS — F410 Panic disorder [episodic paroxysmal anxiety] without agoraphobia: Secondary | ICD-10-CM | POA: Diagnosis present

## 2014-05-25 DIAGNOSIS — R45851 Suicidal ideations: Secondary | ICD-10-CM | POA: Diagnosis present

## 2014-05-25 DIAGNOSIS — G4700 Insomnia, unspecified: Secondary | ICD-10-CM | POA: Diagnosis present

## 2014-05-25 DIAGNOSIS — F1021 Alcohol dependence, in remission: Secondary | ICD-10-CM | POA: Diagnosis present

## 2014-05-25 DIAGNOSIS — Z590 Homelessness: Secondary | ICD-10-CM

## 2014-05-25 DIAGNOSIS — Z23 Encounter for immunization: Secondary | ICD-10-CM

## 2014-05-25 DIAGNOSIS — F17210 Nicotine dependence, cigarettes, uncomplicated: Secondary | ICD-10-CM | POA: Diagnosis present

## 2014-05-25 DIAGNOSIS — Z8249 Family history of ischemic heart disease and other diseases of the circulatory system: Secondary | ICD-10-CM

## 2014-05-25 DIAGNOSIS — Z609 Problem related to social environment, unspecified: Secondary | ICD-10-CM | POA: Diagnosis present

## 2014-05-25 DIAGNOSIS — F419 Anxiety disorder, unspecified: Secondary | ICD-10-CM | POA: Diagnosis present

## 2014-05-25 DIAGNOSIS — Z9114 Patient's other noncompliance with medication regimen: Secondary | ICD-10-CM | POA: Diagnosis present

## 2014-05-25 DIAGNOSIS — F32A Depression, unspecified: Secondary | ICD-10-CM | POA: Diagnosis present

## 2014-05-25 DIAGNOSIS — F329 Major depressive disorder, single episode, unspecified: Secondary | ICD-10-CM | POA: Diagnosis present

## 2014-05-25 LAB — TSH: TSH: 0.636 u[IU]/mL (ref 0.350–4.500)

## 2014-05-25 MED ORDER — HYDROXYZINE HCL 25 MG PO TABS
25.0000 mg | ORAL_TABLET | Freq: Every evening | ORAL | Status: DC | PRN
  Administered 2014-05-25: 25 mg via ORAL
  Filled 2014-05-25: qty 1

## 2014-05-25 MED ORDER — LIDOCAINE HCL (PF) 1 % IJ SOLN
2.1000 mL | Freq: Once | INTRAMUSCULAR | Status: AC
  Administered 2014-05-25: 2.1 mL
  Filled 2014-05-25: qty 5

## 2014-05-25 MED ORDER — ARIPIPRAZOLE 10 MG PO TABS: 5.0000 mg | ORAL_TABLET | Freq: Every day | ORAL | Status: DC

## 2014-05-25 MED ORDER — INFLUENZA VAC SPLIT QUAD 0.5 ML IM SUSY
0.5000 mL | PREFILLED_SYRINGE | INTRAMUSCULAR | Status: AC
  Administered 2014-05-26: 0.5 mL via INTRAMUSCULAR
  Filled 2014-05-25: qty 0.5

## 2014-05-25 MED ORDER — FLUOXETINE HCL 20 MG PO CAPS
20.0000 mg | ORAL_CAPSULE | Freq: Every day | ORAL | Status: DC
  Administered 2014-05-26 – 2014-05-28 (×3): 20 mg via ORAL
  Filled 2014-05-25 (×6): qty 1

## 2014-05-25 MED ORDER — CEFTRIAXONE SODIUM 1 G IJ SOLR: 1.0000 g | Freq: Once | INTRAMUSCULAR | Status: DC

## 2014-05-25 MED ORDER — ALUM & MAG HYDROXIDE-SIMETH 200-200-20 MG/5ML PO SUSP: 30.0000 mL | ORAL | Status: DC | PRN

## 2014-05-25 MED ORDER — TRAZODONE HCL 100 MG PO TABS: 100.0000 mg | ORAL_TABLET | Freq: Every day | ORAL | Status: DC

## 2014-05-25 MED ORDER — FLUOXETINE HCL 20 MG PO CAPS
20.0000 mg | ORAL_CAPSULE | Freq: Every day | ORAL | Status: DC
  Administered 2014-05-25: 20 mg via ORAL
  Filled 2014-05-25 (×2): qty 1

## 2014-05-25 MED ORDER — MAGNESIUM HYDROXIDE 400 MG/5ML PO SUSP: 30.0000 mL | Freq: Every day | ORAL | Status: DC | PRN

## 2014-05-25 MED ORDER — PNEUMOCOCCAL VAC POLYVALENT 25 MCG/0.5ML IJ INJ
0.5000 mL | INJECTION | INTRAMUSCULAR | Status: AC
  Administered 2014-05-26: 0.5 mL via INTRAMUSCULAR

## 2014-05-25 MED ORDER — CEFTRIAXONE SODIUM 1 G IJ SOLR
1.0000 g | Freq: Once | INTRAMUSCULAR | Status: AC
  Administered 2014-05-25: 1 g via INTRAMUSCULAR
  Filled 2014-05-25: qty 10

## 2014-05-25 MED ORDER — CEPHALEXIN 250 MG PO CAPS: 500.0000 mg | ORAL_CAPSULE | Freq: Two times a day (BID) | ORAL | Status: DC

## 2014-05-25 MED ORDER — LIDOCAINE HCL (PF) 1 % IJ SOLN: 3.5000 mL | Freq: Once | INTRAMUSCULAR | Status: DC

## 2014-05-25 MED ORDER — TRAZODONE HCL 100 MG PO TABS
100.0000 mg | ORAL_TABLET | Freq: Every evening | ORAL | Status: DC | PRN
  Administered 2014-05-25: 100 mg via ORAL
  Filled 2014-05-25: qty 1

## 2014-05-25 MED ORDER — ACETAMINOPHEN 325 MG PO TABS: 650.0000 mg | ORAL_TABLET | Freq: Four times a day (QID) | ORAL | Status: DC | PRN

## 2014-05-25 NOTE — [facility] Counselor (Addendum)
Education officer, environmentalWriter left [facility]icemail for [facility]. [facility] says pt hasn't been reviewed yet as their ED is full. Robin [facility] says they don't have pt's referral. Zella BallRobin says that referral can be faxed again to her. Writer faxed referral to [facility]. [facility] reports she doesn't have referral for pt. She says Clinical research associatewriter can fax referralEcologist. Writer faxed referral.  Evette Cristalaroline Paige Jeliyah Middlebrooks, LCSWA Assessment Counselor

## 2014-05-25 NOTE — ED Notes (Signed)
Meal given to pt.

## 2014-05-25 NOTE — ED Notes (Signed)
[facility] CALLED TO TRANSPORT 
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
## 2014-05-25 NOTE — Tx Team (Signed)
Initial Interdisciplinary Treatment Plan   PATIENT STRESSORS: Financial difficulties Marital or family conflict Traumatic event   PATIENT STRENGTHS: Capable of independent living   PROBLEM LIST: Problem List/Patient Goals Date to be addressed Date deferred Reason deferred Estimated date of resolution  Depression  05/25/14     SI 05/25/14     Family conflict 05/25/14                                          DISCHARGE CRITERIA:  Ability to meet basic life and health needs Adequate post-discharge living arrangements Improved stabilization in mood, thinking, and/or behavior Verbal commitment to aftercare and medication compliance  PRELIMINARY DISCHARGE PLAN: Attend aftercare/continuing care group Attend PHP/IOP  PATIENT/FAMIILY INVOLVEMENT: This treatment plan has been presented to and reviewed with the patient, Anderson MaltaChristopher Bogle, and/or family member.  The patient and family have been given the opportunity to ask questions and make suggestions.  Lovelle Deitrick L 05/25/2014, 3:55 PM

## 2014-05-25 NOTE — BH Assessment (Signed)
[facility] Assessment Progress Note    The following Adult psych facilities were contacted in an attempt to place the pt:  Referral faxed for review: [facility] (beds available per Margaret) [facility] (beds available per Amy) [facility] (beds available per Neil) [facility] (beds available per Alisa) [facility] (beds available per Jennifer)  At capacity:  [facility] (at capacity per Brent) [facility] (at capacity per Erica) [facility] (at capacity per Jonathan) [facility] (at capacity per Sissy) [facility] (at capacity per Pat) [facility] (at capacity per Paula) [facility] (at capacity per Kimberly)  No answer: [facility]    TTS will continue to seek placement for the pt.  Emmali Karow, MS, CRC, LPC Licensed Professional Counselor Therapeutic Triage Specialist [facility] Phone: [phone] Fax: [phone] 
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
## 2014-05-25 NOTE — ED Notes (Signed)
VAL[facility]

## 2014-05-25 NOTE — Progress Notes (Signed)
Adult Psychoeducational Group Note  Date:  05/25/2014 Time:  10:02 PM  Group Topic/Focus:  Wrap-Up Group:   The focus of this group is to help patients review their daily goal of treatment and discuss progress on daily workbooks.  Participation Level:  Did Not Attend  PAdditional Comments: Pt was a new admit  Avonna Iribe H 05/25/2014, 10:02 PM

## 2014-05-25 NOTE — ED Notes (Signed)
Addressed pt continued complaint of frequent urination and continued flank pain with dr Wilkie Ayehorton.

## 2014-05-25 NOTE — ED Provider Notes (Signed)
[facility] Dr[facility] Dub MikesLugo.  On keflex for apparent UTI. CT stone negative.  BP 125/85 mmHg  Pulse 82  Temp(Src) 98.2 F (36.8 C) (Oral)  Resp 16  SpO2 97%   Glynn OctaveStephen Altheia Shafran, MD 05/25/14 1408

## 2014-05-25 NOTE — B[facility] Counselor (Signed)
[facility], pt has been [facility] to bed 302[facility]1. IT trainerWriter notified RN Annette [facility] [place] [facility].  Evette Cristalaroline Paige Eldean Nanna, [place]CS[place] Assessment Counselor

## 2014-05-26 DIAGNOSIS — F1121 Opioid dependence, in remission: Secondary | ICD-10-CM

## 2014-05-26 DIAGNOSIS — R45851 Suicidal ideations: Secondary | ICD-10-CM

## 2014-05-26 DIAGNOSIS — F3130 Bipolar disorder, current episode depressed, mild or moderate severity, unspecified: Secondary | ICD-10-CM

## 2014-05-26 DIAGNOSIS — F1020 Alcohol dependence, uncomplicated: Secondary | ICD-10-CM

## 2014-05-26 DIAGNOSIS — F332 Major depressive disorder, recurrent severe without psychotic features: Secondary | ICD-10-CM

## 2014-05-26 LAB — URINE CULTURE
Colony Count: >=100000
Special Requests: NORMAL

## 2014-05-26 MED ORDER — HYDROXYZINE HCL 50 MG PO TABS
50.0000 mg | ORAL_TABLET | Freq: Once | ORAL | Status: AC
  Administered 2014-05-26: 50 mg via ORAL
  Filled 2014-05-26 (×2): qty 1

## 2014-05-26 MED ORDER — NICOTINE 21 MG/24HR TD PT24
21.0000 mg | MEDICATED_PATCH | Freq: Every day | TRANSDERMAL | Status: DC
  Administered 2014-05-26 – 2014-06-01 (×7): 21 mg via TRANSDERMAL
  Filled 2014-05-26 (×2): qty 1
  Filled 2014-05-26: qty 14
  Filled 2014-05-26 (×6): qty 1

## 2014-05-26 MED ORDER — HYDROXYZINE HCL 25 MG PO TABS
25.0000 mg | ORAL_TABLET | Freq: Four times a day (QID) | ORAL | Status: DC | PRN
  Administered 2014-05-28 – 2014-05-29 (×2): 25 mg via ORAL
  Filled 2014-05-26: qty 20
  Filled 2014-05-26 (×3): qty 1

## 2014-05-26 MED ORDER — QUETIAPINE FUMARATE 100 MG PO TABS
250.0000 mg | ORAL_TABLET | Freq: Every day | ORAL | Status: DC
  Administered 2014-05-26: 250 mg via ORAL
  Filled 2014-05-26 (×3): qty 2.5

## 2014-05-26 MED ORDER — QUETIAPINE FUMARATE 25 MG PO TABS
25.0000 mg | ORAL_TABLET | Freq: Two times a day (BID) | ORAL | Status: DC
  Administered 2014-05-26 – 2014-05-27 (×2): 25 mg via ORAL
  Filled 2014-05-26 (×6): qty 1

## 2014-05-26 NOTE — BHH Group Notes (Signed)
[facility] LCSW Group Therapy  05/26/2014   1:15 PM   Type of Therapy:  Group Therapy  Participation Level:  Active  Participation Quality:  Attentive, Sharing and Supportive  Affect:  Depressed and Flat  Cognitive:  Alert and Oriented  Insight:  Developing/Improving and Engaged  Engagement in Therapy:  Developing/Improving and Engaged  Modes of Intervention:  Clarification, Confrontation, Discussion, Education, Exploration, Limit-setting, Orientation, Problem-solving, Rapport Building, Dance movement psychotherapisteality Testing, Socialization and Support  Summary of Progress/Problems: The topic for group therapy was feelings about diagnosis.  Pt actively participated in group discussion on their past and current diagnosis and how they feel towards this.  Pt also identified how society and family members judge them, based on their diagnosis as well as stereotypes and stigmas.  Patient actively listened during group but did not share in discussion.  Samuella BruinKristin Laramie Gelles, MSW, [employer]LCSWA Clinical Social Worker [facility] [phone]
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
## 2014-05-26 NOTE — Progress Notes (Signed)
Adult Psychoeducational Group Note  Date:  05/26/2014 Time:  10:03 PM  Group Topic/Focus:  Wrap-Up Group:   The focus of this group is to help patients review their daily goal of treatment and discuss progress on daily workbooks.  Participation Level:  Active  Participation Quality:  Sharing  Affect:  Appropriate  Cognitive:  Appropriate  Insight: Good  Engagement in Group:  Engaged  Modes of Intervention:  Discussion  Additional Comments:  Pt goal was not to sleep in bed today. Pt accomplish his goal by going to groups and participating.  Quavon Keisling H 05/26/2014, 10:03 PM

## 2014-05-26 NOTE — B[facility] Group Notes (Signed)
B[facility] Group Notes:  (Nursing/MHT/Case Management/Adjunct)  Date:  05/26/2014  Time:  0845am  Type of Therapy:  Psychoeducational Skills  Participation Level:  Active  Participation Quality:  Appropriate and Attentive  Affect:  Blunted, Depressed and Irritable  Cognitive:  Alert and Appropriate  Insight:  Limited  Engagement in Group:  Limited  Modes of Intervention:  Education and Support  Summary of Progress/Problems: Pt reports his recovery goal is "getting back to what I once was" and "staying on meds"  Laurine BlazerGuthrie, [place]adaBrittany A 05/26/2014, 2:03 PM
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
## 2014-05-26 NOTE — Progress Notes (Signed)
D: Patient is alert and oriented. Pt's mood and affect is "low," agitated, and blunted. Pt reports hopelessness and depression 8/10 and anxiety 10/10. Pt states he is feeling suicidal and denies having a plan. Pt observed placing head against wall and states "I just want to put my head through a wall." Pt reports agitation and reports the hospital construction noise is bothering him. Pt requests PRN medication for agitation and a nicotine patch. Pt reports his goal for the day is to "stay out of bed." Pt denies HI and AVH at this time. A: Ear plugs given. NP made aware of pts increased agitation and orders acknowledged. Scheduled medications administered per providers orders (See MAR). 15 minute checks completed per protocol for pt safety. R: Pt verbally contracts not to harm self. Pt cooperative and receptive to nursing interventions.

## 2014-05-26 NOTE — Plan of Care (Signed)
Problem: Ineffective individual coping Goal: STG: Patient will remain free from self harm Outcome: Progressing Patient remains free from self harm and is able to verbally contract for safety with RN. 15 minute checks completed per protocol for pt safety.  Problem: Alteration in mood Goal: LTG-Patient reports reduction in suicidal thoughts (Patient reports reduction in suicidal thoughts and is able to verbalize a safety plan for whenever patient is feeling suicidal)  Outcome: Not Progressing Patient reports feelings of suicidal thoughts this am.

## 2014-05-26 NOTE — Progress Notes (Signed)
D: Pt isolative to his room this evening. Pt was guarded in interaction with this Clinical research associatewriter. Pt denies any current suicidal ideation. Pt denied any concerns he wishes for this writer to address at this time.  A: Continued support and availability as needed was extended to this pt. Staff continue to monitor pt with q5915min checks.  R: Pt receptive to treatment. Pt remains safe at this time.

## 2014-05-26 NOTE — Progress Notes (Signed)
Patient ID: Alexander MaltaChristopher [name], male   DOB: 10-27-1975, 38 y.o.   MRN: [id number] D: Client visible on the unit watching TV. Client reports anxiety at "4" of 10. A: Writer introduced self to client, reviewed medications, administered as ordered. Staff will monitor q2215min for safety. R: Client is safe on the unit, attended group.
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
## 2014-05-26 NOTE — [facility] Suicide Risk Assessment (Signed)
[facility] INPATIENT:  Family/Significant Other Suicide Prevention Education  Suicide Prevention Education:  Patient Refusal for Family/Significant Other Suicide Prevention Education: The patient Alexander [name] has refused to provide written consent for family/significant other to be provided Family/Significant Other Suicide Prevention Education during admission and/or prior to discharge.  Physician notified. SPE reviewed with patient, brochure provided.  Ashantia Amaral, [place] CarboKristin L 05/26/2014, 4:12 PM

## 2014-05-26 NOTE — Progress Notes (Signed)
Recreation Therapy Notes  Animal-Assisted Activity/Therapy (AAA/T) Program Checklist/Progress Notes Patient Eligibility Criteria Checklist & Daily Group note for Rec Tx Intervention  Date: 12.01.2015 Time: 2:45pm Location: [address] Dayroom   AAA/T Program Assumption of Risk Form signed by Patient/ or Parent Legal Guardian yes  Patient is free of allergies or sever asthma yes  Patient reports no fear of animals yes  Patient reports no history of cruelty to animals yes  Patient understands his/her participation is voluntary yes  Patient washes hands before animal contact yes  Patient washes hands after animal contact yes  Behavioral Response: Appropriate   Education: Hand Washing, Appropriate Animal Interaction   Education Outcome: Acknowledges education.   Clinical Observations/Feedback: Patient interacted with therapy dog, petting him appropriately. Patient socialized with peers in session, conversation remained appropriate.   Marykay Lexenise L Buell Parcel, LRT/CTRS  Krisa Blattner L 05/26/2014 5:05 PM

## 2014-05-26 NOTE — [facility] Suicide Risk Assessment (Signed)
   Nursing information obtained from:  Patient Demographic factors:  Male, Caucasian, Low socioeconomic status Current Mental Status:  Suicidal ideation indicated by patient, Suicidal ideation indicated by others, Suicide plan Loss Factors:  Financial problems / change in socioeconomic status Historical Factors:  Family history of suicide Risk Reduction Factors:  Sense of responsibility to family Total Time spent with patient: 45 minutes  CLINICAL FACTORS:  Depression, history of substance and alcohol abuse   Psychiatric Specialty Exam: Physical Exam  ROS  Blood pressure 124/91, pulse 109, temperature 98.5 F (36.9 C), temperature source Oral, resp. rate 20, height 5' 8.5" (1.74 m), weight 81.194 kg (179 lb).Body mass index is 26.82 kg/(m^2).  See admit note MSE   COGNITIVE FEATURES THAT CONTRIBUTE TO RISK:  Closed-mindedness    SUICIDE RISK:   Moderate:  Frequent suicidal ideation with limited intensity, and duration, some specificity in terms of plans, no associated intent, good self-control, limited dysphoria/symptomatology, some risk factors present, and identifiable protective factors, including available and accessible social support.  PLAN OF CARE: Patient will be admitted to inpatient psychiatric unit for stabilization and safety. Will provide and encourage milieu participation. Provide medication management and maked adjustments as needed.  Will follow daily.    I certify that inpatient services furnished can reasonably be expected to improve the patient's condition.  Aaleah Hirsch 05/26/2014, 12:07 PM

## 2014-05-26 NOTE — Progress Notes (Signed)
Pt attended spiritual care group on grief and loss facilitated by counseling intern [place] Austin and chaplain Burnis KingfisherMatthew Denica Web. Group opened with brief discussion and psycho-social ed around grief and loss in relationships and in relation to self - identifying life patterns, circumstances, changes that cause losses. Established group norm of speaking from own life experience. Group goal of establishing open and affirming space for members to share loss and experience with grief, normalize grief experience and provide psycho social education and grief support.  Alexander [name] entered group after it began and left group several times. Alexander [name] behaved appropriately while present in group, but did not share.  [place]azil[place]Jordan Austin  Counseling Intern

## 2014-05-27 LAB — LIPID PANEL
CHOL/HDL RATIO: 3.7 ratio
CHOLESTEROL: 154 mg/dL (ref 0–200)
HDL: 42 mg/dL (ref >39)
LDL Cholesterol: 87 mg/dL (ref 0–99)
Triglycerides: 123 mg/dL (ref <150)
VLDL: 25 mg/dL (ref 0–40)

## 2014-05-27 MED ORDER — ENSURE COMPLETE PO LIQD
237.0000 mL | Freq: Two times a day (BID) | ORAL | Status: DC
  Administered 2014-05-27 – 2014-06-01 (×7): 237 mL via ORAL

## 2014-05-27 MED ORDER — QUETIAPINE FUMARATE 25 MG PO TABS
25.0000 mg | ORAL_TABLET | Freq: Every morning | ORAL | Status: DC
  Administered 2014-05-28 – 2014-06-01 (×5): 25 mg via ORAL
  Filled 2014-05-27 (×6): qty 1
  Filled 2014-05-27: qty 14
  Filled 2014-05-27: qty 1

## 2014-05-27 MED ORDER — QUETIAPINE FUMARATE 50 MG PO TABS
350.0000 mg | ORAL_TABLET | Freq: Every day | ORAL | Status: DC
  Administered 2014-05-27 – 2014-05-31 (×5): 350 mg via ORAL
  Filled 2014-05-27 (×9): qty 1

## 2014-05-27 NOTE — [facility] Group Notes (Signed)
[facility] LCSW Group Therapy 05/27/2014  1:15 PM Type of Therapy: Group Therapy Participation Level: Active  Participation Quality: Attentive, Sharing and Supportive  Affect: Depressed and Flat  Cognitive: Alert and Oriented  Insight: Developing/Improving and Engaged  Engagement in Therapy: Developing/Improving and Engaged  Modes of Intervention: Clarification, Confrontation, Discussion, Education, Exploration, Limit-setting, Orientation, Problem-solving, Rapport Building, Dance movement psychotherapisteality Testing, Socialization and Support  Summary of Progress/Problems: The topic for group today was emotional regulation. This group focused on both positive and negative emotion identification and allowed group members to process ways to identify feelings, regulate negative emotions, and find healthy ways to manage internal/external emotions. Group members were asked to reflect on a time when their reaction to an emotion led to a negative outcome and explored how alternative responses using emotion regulation would have benefited them. Group members were also asked to discuss a time when emotion regulation was utilized when a negative emotion was experienced. Patient identified anger as the primary emotion that he would like to work on. He discussed his past tendencies to react with anger and getting involved in other people's conflicts. CSW's emphasized the negative effects of substances on mood and discussed alternative ways to cope with anxiety and anger. CSW's and other group members provided emotional support and encouragement.   Samuella BruinKristin Banjamin Stovall, MSW, [employer]CSWA Clinical Social Worker [facility] [phone]

## 2014-05-27 NOTE — Progress Notes (Signed)
[facility] MD Progress Note  05/27/2014 4:25 PM Alexander [name]  MRN:  [id number] Subjective:    Patient states that he is " a little better", but still feels anxious, and complains of insomnia. He states he feels Seroquel dose needs to be increased. He states Seroquel has been effective for him and did not cause side effects, except for some weight gain, which he states " I can use some weight gain anyway".  Objective:  I have discussed case with treatment team and have met with team. As per staff, patient has been visible on unit, going to groups. He does continue to report depression, but today seems better, less sad and constricted than  Upon admission. As noted, patient reports ongoing anxiety and poor sleep , and is wanting to increase Seroquel dose, as he identifies Seroquel as effective for these symptoms. He states he has been on up to 600 mgrs a day in the past. Patient remains future oriented, and is hoping to go back to [facility] after discharge and to go back to working at [employer]. Denies side effects from meds at this time. We have reviewed labs, Lipid panel within normal.TSH normal.   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 rate 18, height 5' 8.5" (1.74 m), weight 81.194 kg (179 lb).Body mass index is 26.82 kg/(m^2).  General Appearance: Fairly Groomed  Engineer, water::  Good  Speech:  Normal Rate  Volume:  Normal  Mood:  Depressed-but improved today  Affect:   Constricted and anxious, but improved compared to admission  Thought Process:  Goal Directed and Linear  Orientation:  Full (Time, Place, and Person)  Thought Content:  no hallucinations, no delusions  Suicidal Thoughts:  No At this time denies any suicidal plan or intent and contracts for safety on the unit  Homicidal Thoughts:  No  Memory: Recent and Remote grossly intact  Judgement:  Fair  Insight:  Fair  Psychomotor Activity:  Normal  Concentration:  Fair  Recall:  Good  Fund of Knowledge:Good  Language: Good  Akathisia:  Negative  Handed:  Right  AIMS (if indicated):     Assets:  Desire for Improvement Resilience  Sleep:  Number of Hours: 6.75   Musculoskeletal: Strength & Muscle Tone: within normal limits Gait & Station: normal Patient leans: N/A  Current Medications: Current Facility-Administered Medications  Medication Dose Route Frequency Provider Last Rate Last Dose  . acetaminophen (TYLENOL) tablet 650 mg  650 mg Oral Q6H PRN Janett Labella, NP      . alum & mag hydroxide-simeth (MAALOX/MYLANTA) 200-200-20 MG/5ML suspension 30 mL  30 mL Oral Q4H PRN Janett Labella, NP      . feeding supplement (ENSURE COMPLETE) (ENSURE COMPLETE) liquid 237 mL  237 mL Oral BID BM Clayton Bibles, RD      . FLUoxetine (PROZAC) capsule 20 mg  20 mg Oral Daily Sheila May Agustin, NP   20 mg at 05/27/14 7902  . hydrOXYzine (ATARAX/VISTARIL) tablet  25 mg  25 mg Oral Q6H PRN Encarnacion Slates, NP      . magnesium hydroxide (MILK OF MAGNESIA) suspension 30 mL  30 mL Oral Daily PRN Freda Munro May Agustin, NP      . nicotine (NICODERM CQ - dosed in mg/24 hours) patch 21 mg  21 mg Transdermal Daily Jenne Campus, MD   21 mg at 05/27/14 0806  . QUEtiapine (SEROQUEL) tablet 25 mg  25 mg Oral BID Jenne Campus, MD   25 mg at 05/27/14 0806  . QUEtiapine (SEROQUEL) tablet 250 mg  250 mg Oral QHS Jenne Campus, MD   250 mg at 05/26/14 2148    Lab Results:  Results for orders placed or  performed during the hospital encounter of 05/25/14 (from the past 48 hour(s))  TSH     Status: None   Collection Time: 05/25/14  7:31 PM  Result Value Ref Range   TSH 0.636 0.350 - 4.500 uIU/mL    Comment: Performed at [facility]  Lipid panel     Status: None   Collection Time: 05/27/14  6:25 AM  Result Value Ref Range   Cholesterol 154 0 - 200 mg/dL   Triglycerides 123 <150 mg/dL   HDL 42 >39 mg/dL   Total CHOL/HDL Ratio 3.7 RATIO   VLDL 25 0 - 40 mg/dL   LDL Cholesterol 87 0 - 99 mg/dL    Comment:        Total Cholesterol/HDL:CHD Risk Coronary Heart Disease Risk Table                     Men   Women  1/2 Average Risk   3.4   3.3  Average Risk       5.0   4.4  2 X Average Risk   9.6   7.1  3 X Average Risk  23.4   11.0        Use the calculated Patient Ratio above and the CHD Risk Table to determine the patient's CHD Risk.        ATP III CLASSIFICATION (LDL):  <100     mg/dL   Optimal  100-129  mg/dL   Near or Above                    Optimal  130-159  mg/dL   Borderline  160-189  mg/dL   High  >190     mg/dL   Very High Performed at [facility]     Physical Findings: AIMS: Facial and Oral Movements Muscles of Facial Expression: None, normal Lips and Perioral Area: None, normal Jaw: None, normal Tongue: None, normal,Extremity Movements Upper (arms, wrists, hands, fingers): None, normal Lower (legs, knees, ankles, toes): None, normal, Trunk Movements Neck, shoulders, hips: None, normal, Overall Severity Incapacitation due to abnormal movements: None, normal Patient's awareness of abnormal movements (rate only patient's report): No Awareness, Dental Status Current problems with teeth and/or dentures?: No Does patient usually wear dentures?: No  CIWA:  CIWA-Ar Total: 0 COWS:     Treatment Plan Summary: Daily contact with patient to assess and evaluate symptoms and progress in treatment Medication management See below   Assessment: At this  time patient remains anxious, and describes fair sleep. Although still depressed, he is better than upon admission. Not suicidal. He reports no side effects, but states he does better on higher doses of Seroquel, which he has tolerated well in the past .  Plan: Continue inpatient treatment , milieu, support Increase Seroquel to  25 mgrs QAM and 350 mgrs QHS Continue Prozac 20 mgrs QAM Medical Decision Making Problem Points:  Established problem, stable/improving (1), Review of last therapy session (1) and Review of psycho-social stressors (1) Data Points:  Review or order clinical lab tests (1) Review of medication regiment & side effects (2) Review of new medications or change in dosage (2)  I certify that inpatient services furnished can reasonably be expected to improve the patient's condition.   COBOS, [place] 05/27/2014, 4:25 PM

## 2014-05-27 NOTE — [facility] Group Notes (Signed)
   [facility]  05/27/2014  8:45 AM   Participation Quality: Alert, Appropriate and Oriented  Mood/Affect: Depressed and Flat  Depression Rating: 5/6  Anxiety Rating: 0  Thoughts of Suicide: Pt denies SI/HI  Will you contract for safety? Yes  Current AVH: Pt denies  Plan for Discharge/Comments: Pt attended discharge planning group and actively participated in group. CSW provided pt with today's workbook. Patient reports feeling "okay, bored, but less depressed" since admission. [facility] and [facility].   Transportation Means: Pt reports access to transportation via bus system  Supports: No supports mentioned at this time  Samuella BruinKristin Drianna Chandran, MSW, [employer]CSWA Clinical Social Worker [facility] [phone]

## 2014-05-27 NOTE — Progress Notes (Signed)
NUTRITION ASSESSMENT  Pt identified as at risk on the Malnutrition Screen Tool  INTERVENTION: 1. Educated patient on the importance of nutrition and encouraged intake of food and beverages. 2. Discussed weight goals. 3. Supplements: Ensure Complete po BID, each supplement provides 350 kcal and 13 grams of protein   NUTRITION DIAGNOSIS: Unintentional weight loss related to sub-optimal intake as evidenced by 10% weight loss x 5 months.  Goal: Pt to meet >/= 90% of their estimated nutrition needs.  Monitor:  PO intake  Assessment:  Pt admitted with depression, weight loss and SI. PMHx of alcohol abuse and heroin use.  Pt has had 19 lb weight loss since July (10% wt loss x 5 months).  Pt with fair appetite per RN note. Pt would benefit from nutritional supplement. RD to order Ensure BID.  Height: Ht Readings from Last 1 Encounters:  05/25/14 5' 8.5" (1.74 m)    Weight: Wt Readings from Last 1 Encounters:  05/25/14 179 lb (81.194 kg)    Weight Hx: Wt Readings from Last 10 Encounters:  05/25/14 179 lb (81.194 kg)  01/20/14 198 lb (89.812 kg)  12/30/13 190 lb (86.183 kg)    BMI:  Body mass index is 26.82 kg/(m^2). Pt meets criteria for overweight based on current BMI.  Estimated Nutritional Needs: Kcal: 25-30 kcal/kg Protein: > 1 gram protein/kg Fluid: 1 ml/kcal  Diet Order: Diet regular Pt is also offered choice of unit snacks mid-morning and mid-afternoon.  Pt is eating as desired.   Lab results and medications reviewed.   Tilda FrancoLindsey Avyay Coger, MS, RD, LDN Pager: [phone] After Hours Pager: [phone]

## 2014-05-27 NOTE — Plan of Care (Signed)
Problem: Ineffective individual coping Goal: STG-Increase in ability to manage activities of daily living Outcome: Progressing Patient is progressing in managing ADL

## 2014-05-27 NOTE — Progress Notes (Signed)
Patient mood and affects flat and depressed. He denied SI/HI and denied Hallucinations also denied withdrawal symptoms. Writer encouraged and supported patient. Q 15 minute check continues as ordered to maintain safety.

## 2014-05-27 NOTE — Progress Notes (Signed)
Patient ID: Alexander [name], male   DOB: May 29, 1976, 38 y.o.   MRN: [id number]  D: Pt. Denies SI/HI and A/V Hallucinations. Patient reports sleep was fair last night, appetite is fair, and energy level is normal. Patien reports concentration is low today. Patient does not report any new onset of pain or discomfort at this time.   A: Support and encouragement provided to the patient to come to writer with any questions or concerns. Scheduled medications administered to patient per physician's orders.  R: Patient is receptive and cooperative bu minimal and forwards very little. Patient is attending groups and for his goal he plans to continue this. Q15 minute checks are maintained for safety.

## 2014-05-27 NOTE — Clinical Social Work Note (Signed)
CSW [facility] possibility of holding his bed. Awaiting return call.  Samuella BruinKristin Mussa Groesbeck, MSW, [employer]CSWA Clinical Social Worker [facility] [phone]

## 2014-05-28 MED ORDER — FLUOXETINE HCL 20 MG PO CAPS
40.0000 mg | ORAL_CAPSULE | Freq: Every day | ORAL | Status: DC
  Administered 2014-05-29 – 2014-06-01 (×4): 40 mg via ORAL
  Filled 2014-05-28 (×6): qty 2
  Filled 2014-05-28: qty 28

## 2014-05-28 NOTE — [facility] Group Notes (Signed)
0900 nursing orientation group   The focus of this group is to educate the patient on the purpose and policies of crisis stabilization and provide a format to answer questions about their admission.  The group details unit policies and expectations of patients while admitted.t was an active participant in group and was appropriate in her sharing.   Pt was an active participant in group and was appropriate in sharing.

## 2014-05-28 NOTE — Clinical Social Work Note (Signed)
CS[facility] with [facility]. He will have to call the day of discharge to inquire about bed availability. Patient verbalized his understanding.   Samuella BruinKristin Zyrion Coey, MSW, [employer]CSWA Clinical Social Worker [facility] [phone]

## 2014-05-28 NOTE — Progress Notes (Signed)
Patient ID: Alexander MaltaChristopher [name], male   DOB: [birth year], 38 y.o.   MRN: [id number] He has been up and to one group. Interacting with peers and staff. Self inventory: Depression, hopelessness and anxiety are at 4. He denies SI thoughts He has c/o back pain but has not requested any prn medications. Affect flat sad. Goal is to stay out of bed and go to groups. When he talks to you he holds his head down and speaks in a low voice.

## 2014-05-28 NOTE — Progress Notes (Signed)
Patient ID: Alexander [name], male   DOB: [birth year], 38 y.o.   MRN: [id number] [facility] MD Progress Note  05/28/2014 1:23 PM Wissam Resor  MRN:  [id number] Subjective:    Although still reporting some depression and anxiety, he admits he is feeling better. He does feel medications are helping, albeit partially, and he is wanting to try a higher dose of  Prozac, because " this dose helps but only to a certain point". He also expressed interest in Abilify, but as on Seroquel, which he feels is very effective, understood rationale to avoid using two antipsychotic medications concurrently.  Objective:  I have discussed case with treatment team and have met with team. As above, patient describes partial improvement, and he does seem better than when he was admitted, but continues to describe some residual depression and anxiety.  Denies medication side effects. Today seems more future oriented and states his mid term goal is to save enough money to allow him to relocate to [place], where his son lives . He is wanting to " become a part of my son's life". No disruptive behaviors on unit . Going to groups and is visible in dayroom. Lipid panel within normal limits.   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 rate 18, height 5' 8.5" (1.74 m), weight 81.194 kg (179 lb).Body mass index is 26.82 kg/(m^2).  General Appearance: improved grooming  Eye Contact::  Good  Speech:  Normal Rate  Volume:  Normal  Mood:  depression is improved, but affect still  constricted, and anxious, although to a lesser degree-  Affect:  Constricted and anxious, but improved compared to admission  Thought Process:  Goal Directed and Linear  Orientation:  Full (Time, Place, and Person)  Thought Content:  no hallucinations, no delusions- future oriented, as noted above .  Suicidal Thoughts:  No At this time denies any suicidal plan or intent and contracts for safety on the unit  Homicidal Thoughts:  No  Memory: Recent and Remote grossly intact  Judgement:  Fair  Insight:  Fair  Psychomotor Activity:  Normal  Concentration:  Fair  Recall:  Good  Fund of Knowledge:Good  Language: Good  Akathisia:  Negative  Handed:  Right  AIMS (if indicated):     Assets:  Desire for Improvement Resilience  Sleep:  Number of Hours: 6.5   Musculoskeletal: Strength & Muscle Tone: within normal limits Gait & Station: normal Patient leans: N/A  Current Medications: Current Facility-Administered Medications  Medication Dose Route Frequency Provider Last Rate Last Dose  . acetaminophen (TYLENOL) tablet 650 mg  650 mg Oral Q6H PRN Janett Labella, NP      . alum & mag hydroxide-simeth (MAALOX/MYLANTA) 200-200-20 MG/5ML suspension 30 mL  30 mL Oral Q4H PRN Janett Labella, NP      . feeding supplement (ENSURE COMPLETE) (ENSURE COMPLETE) liquid 237 mL  237 mL Oral BID BM Clayton Bibles, RD   237 mL at 05/28/14 4967  . [START ON 05/29/2014] FLUoxetine (PROZAC) capsule 40 mg  40 mg Oral Daily Jenne Campus, MD      .  hydrOXYzine (ATARAX/VISTARIL) tablet 25 mg  25 mg Oral Q6H PRN Encarnacion Slates, NP      . magnesium hydroxide (MILK OF MAGNESIA) suspension 30 mL  30 mL Oral Daily PRN Freda Munro May Agustin, NP      . nicotine (NICODERM CQ - dosed in mg/24 hours) patch 21 mg  21 mg Transdermal Daily Jenne Campus, MD   21 mg at 05/28/14 0810  . QUEtiapine (SEROQUEL) tablet 25 mg  25 mg Oral q morning - 10a Jenne Campus, MD   25 mg at 05/28/14 0811  . QUEtiapine (SEROQUEL)  tablet 350 mg  350 mg Oral QHS Jenne Campus, MD   350 mg at 05/27/14 2217    Lab Results:  Results for orders placed or performed during the hospital encounter of 05/25/14 (from the past 48 hour(s))  Lipid panel     Status: None   Collection Time: 05/27/14  6:25 AM  Result Value Ref Range   Cholesterol 154 0 - 200 mg/dL   Triglycerides 123 <150 mg/dL   HDL 42 >39 mg/dL   Total CHOL/HDL Ratio 3.7 RATIO   VLDL 25 0 - 40 mg/dL   LDL Cholesterol 87 0 - 99 mg/dL    Comment:        Total Cholesterol/HDL:CHD Risk Coronary Heart Disease Risk Table                     Men   Women  1/2 Average Risk   3.4   3.3  Average Risk       5.0   4.4  2 X Average Risk   9.6   7.1  3 X Average Risk  23.4   11.0        Use the calculated Patient Ratio above and the CHD Risk Table to determine the patient's CHD Risk.        ATP III CLASSIFICATION (LDL):  <100     mg/dL   Optimal  100-129  mg/dL   Near or Above                    Optimal  130-159  mg/dL   Borderline  160-189  mg/dL   High  >190     mg/dL   Very High Performed at [facility]     Physical Findings: AIMS: Facial and Oral Movements Muscles of Facial Expression: None, normal Lips and Perioral Area: None, normal Jaw: None, normal Tongue: None, normal,Extremity Movements Upper (arms, wrists, hands, fingers): None, normal Lower (legs, knees, ankles, toes): None, normal, Trunk Movements Neck, shoulders, hips: None, normal, Overall Severity Incapacitation due to abnormal movements: None, normal Patient's awareness of abnormal movements (rate only patient's report): No Awareness, Dental Status Current problems with teeth and/or dentures?: No Does patient usually wear dentures?: No  CIWA:  CIWA-Ar Total: 0 COWS:     Treatment Plan Summary: Daily contact with patient to assess and evaluate symptoms and progress in treatment Medication management See below   Assessment: Patient presents with partial improvement, and  attributes this to increased Seroquel dose. Although better, does describe ongoing sense of depression and anxiety. He is , however, presenting with improved range of affect, no SI , and is more future oriented . Tolerating medications well- interested in titrating Prozac further..  Plan: Continue inpatient treatment , milieu, support  Seroquel  25 mgrs QAM and 350 mgrs QHS  Increase Prozac  To 40 mgrs QAM Medical Decision  Making Problem Points:  Established problem, stable/improving (1), Review of last therapy session (1) and Review of psycho-social stressors (1) Data Points:  Review or order clinical lab tests (1) Review of medication regiment & side effects (2) Review of new medications or change in dosage (2)  I certify that inpatient services furnished can reasonably be expected to improve the patient's condition.   Ildefonso Keaney, [place] 05/28/2014, 1:23 PM

## 2014-05-28 NOTE — Progress Notes (Signed)
D   Pt was pleasant on approach and attended group this evening   He endorses anxiety and depression but reports some improvement since coming in    He has been compliant with treatment and medications  He interacts appropriately with others A   Verbal support given     Medications administered and effectiveness monitored   Q 15 min checks R   Pt safe at present

## 2014-05-28 NOTE — Progress Notes (Signed)
Pt stated that his goal for today was to stay out of bed and he did. He is ready to be discharged so that he can go back to work.

## 2014-05-28 NOTE — Progress Notes (Signed)
Recreation Therapy Notes  Animal-Assisted Activity/Therapy (AAA/T) Program Checklist/Progress Notes Patient Eligibility Criteria Checklist & Daily Group note for Rec Tx Intervention  Date: 12.03.2015 Time: 2:45pm Location: 300 Hall Dayroom    AAA/T Program Assumption of Risk Form signed by Patient/ or Parent Legal Guardian yes  Patient is free of allergies or sever asthma yes  Patient reports no fear of animals yes  Patient reports no history of cruelty to animals yes  Patient understands his/her participation is voluntary yes  Patient washes hands before animal contact yes  Patient washes hands after animal contact yes  Behavioral Response: Appropriate   Education: Hand Washing, Appropriate Animal Interaction   Education Outcome: Acknowledges education.   Clinical Observations/Feedback: Patient engaged with therapy dog petting him appropriately and asked appropriate questions about therapy dog and his training. Patient additionally shared stories about his pets at home and fed therapy dog treats in exchange for performing basic obedience skills.   Alexander [name], LRT/CTRS  Alexander [name], Alexander [name] 05/28/2014 4:33 PM

## 2014-05-28 NOTE — [facility] Group Notes (Signed)
[facility] LCSW Group Therapy 05/28/2014 1:15 PM Type of Therapy: Group Therapy Participation Level: Active  Participation Quality: Attentive, Sharing and Supportive  Affect: Depressed and Flat  Cognitive: Alert and Oriented  Insight: Developing/Improving and Engaged  Engagement in Therapy: Developing/Improving and Engaged  Modes of Intervention: Activity, Clarification, Confrontation, Discussion, Education, Exploration, Limit-setting, Orientation, Problem-solving, Rapport Building, Reality Testing, Socialization and Support  Summary of Progress/Problems: Patient was attentive and engaged with speaker from [employer]. Patient was attentive to speaker while they shared their story of dealing with mental health and overcoming it. Patient expressed interest in their programs and services and received information on their agency. Patient processed ways they can relate to the speaker.   Domnic Vantol, MSW, LCSWA Clinical Social Worker [facility] [phone]    

## 2014-05-28 NOTE — Progress Notes (Signed)
Pt did attend NA group this evening.  

## 2014-05-29 NOTE — Plan of Care (Signed)
Problem: Alteration in mood Goal: STG-Patient reports thoughts of self-harm to staff Outcome: Progressing Pt denies any suicidal ideation today and he does contract verbally to come to staff before acting on any self-harm thoughts.

## 2014-05-29 NOTE — Progress Notes (Signed)
Writer observed patient up in the dayroom playing UNO with peers. He was laughing and talking with peers and voiced no complaints. He reports that he is just waiting to be discharged on Monday, he denies si/hi/a/v hallucinations and request his hs medication at 2130. Support and encouragement given. Safety maintained on unit with 15 min checks.

## 2014-05-29 NOTE — Progress Notes (Signed)
Patient ID: Alexander [name], male   DOB: February 21, 1976, 38 y.o.   MRN: [id number] [facility] MD Progress Note  05/29/2014 3:42 PM Alexander [name]  MRN:  [id number] Subjective:    Patient states he feels more depressed and anxious today and states " I don't think I am ready for discharge yet" .  Objective:  I have discussed case with treatment team and have met with team. Although describes some improvement compared to admission status, states that yesterday and today has been feeling more anxious and vaguely more depressed.  Today reports anxiety and depression as 8/10. He does deny any suicidal ideations at present. He tends to focus on disposition planning , and I met with him along with Education officer, museum. He wants to return to [facility], and worries about homelessness status, particularly as winter approaches. He is concerned this shelter may be full, not accept him. He did seem reassured related to being considered for TCS services, as discussed with treatment team. Denies medication side effects. No disruptive behaviors on unit and going to groups.   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 rate 16, height 5' 8.5" (1.74 m), weight 81.194 kg (179 lb).Body mass index is 26.82 kg/(m^2).  General Appearance: improved grooming  Eye Contact::  Good  Speech:  Normal Rate  Volume:  Normal  Mood:  depressed -  Affect:  improved compared to admission, but today still significantly anxious and with a somewhat constricted affect  Thought Process:  Goal Directed  and Linear  Orientation:  Full (Time, Place, and Person)  Thought Content:  no hallucinations, no delusions-   Suicidal Thoughts:  No At this time denies any suicidal plan or intent and contracts for safety on the unit  Homicidal Thoughts:  No  Memory: Recent and Remote grossly intact  Judgement:  Fair  Insight:  Fair  Psychomotor Activity:  Normal  Concentration:  Fair  Recall:  Good  Fund of Knowledge:Good  Language: Good  Akathisia:  Negative  Handed:  Right  AIMS (if indicated):     Assets:  Desire for Improvement Resilience  Sleep:  Number of Hours: 6   Musculoskeletal: Strength & Muscle Tone: within normal limits Gait & Station: normal Patient leans: N/A  Current Medications: Current Facility-Administered Medications  Medication Dose Route Frequency Provider Last Rate Last Dose  . acetaminophen (TYLENOL) tablet 650 mg  650 mg Oral Q6H PRN Janett Labella, NP      . alum & mag hydroxide-simeth (MAALOX/MYLANTA) 200-200-20 MG/5ML suspension 30 mL  30 mL Oral Q4H PRN Janett Labella, NP      . feeding supplement (ENSURE COMPLETE) (ENSURE COMPLETE) liquid 237 mL  237 mL Oral BID BM Clayton Bibles, RD   237 mL at 05/29/14 1334  . FLUoxetine (PROZAC) capsule 40 mg  40 mg Oral Daily Jenne Campus, MD   40 mg at 05/29/14 9509  . hydrOXYzine (ATARAX/VISTARIL) tablet 25 mg  25 mg Oral Q6H PRN Encarnacion Slates, NP   25 mg at 05/29/14 1306  . magnesium hydroxide (MILK OF MAGNESIA) suspension  30 mL  30 mL Oral Daily PRN Janett Labella, NP      . nicotine (NICODERM CQ - dosed in mg/24 hours) patch 21 mg  21 mg Transdermal Daily Jenne Campus, MD   21 mg at 05/29/14 0811  . QUEtiapine (SEROQUEL) tablet 25 mg  25 mg Oral q morning - 10a Jenne Campus, MD   25 mg at 05/29/14 0933  . QUEtiapine (SEROQUEL) tablet 350 mg  350 mg Oral QHS Jenne Campus, MD   350 mg at 05/28/14 2141    Lab Results:  No results found for this or any previous visit (from the past 48  hour(s)).  Physical Findings: AIMS: Facial and Oral Movements Muscles of Facial Expression: None, normal Lips and Perioral Area: None, normal Jaw: None, normal Tongue: None, normal,Extremity Movements Upper (arms, wrists, hands, fingers): None, normal Lower (legs, knees, ankles, toes): None, normal, Trunk Movements Neck, shoulders, hips: None, normal, Overall Severity Incapacitation due to abnormal movements: None, normal Patient's awareness of abnormal movements (rate only patient's report): No Awareness, Dental Status Current problems with teeth and/or dentures?: No Does patient usually wear dentures?: No  CIWA:  CIWA-Ar Total: 0 COWS:     Treatment Plan Summary: Daily contact with patient to assess and evaluate symptoms and progress in treatment Medication management See below   Assessment: Today describes increased depression and anxiety, at least partially related to concerns about disposition and homelessness. Does present anxious and sad. He denies suicidal ideations, and remains future oriented, for example wanting to relocate to [place] in the future. He is tolerating medications well.   Plan: Continue inpatient treatment , milieu, support Seroquel  25 mgrs QAM and 350 mgrs QHS Prozac  40 mgrs QAM Medical Decision Making Problem Points:  Established problem, worsening (2), Review of last therapy session (1) and Review of psycho-social stressors (1) Data Points:  Review of medication regiment & side effects (2) Review of new medications or change in dosage (2)  I certify that inpatient services furnished can reasonably be expected to improve the patient's condition.   COBOS, FERNANDO 05/29/2014, 3:42 PM

## 2014-05-29 NOTE — Tx Team (Signed)
Interdisciplinary Treatment Plan Update (Adult) Date: 05/29/2014   Time Reviewed: 9:30 AM  Progress in Treatment: Attending groups: Yes Participating in groups: Yes Taking medication as prescribed: Yes Tolerating medication: Yes Family/Significant other contact made: No, Patient has declined for CSW to make collateral contact Patient understands diagnosis: Yes Discussing patient identified problems/goals with staff: Yes Medical problems stabilized or resolved: Yes Denies suicidal/homicidal ideation: Treatment team continuing to assess Issues/concerns per patient self-inventory: Yes Other:  New problem(s) identified: N/A  Discharge Plan or Barriers: Patient plans to discharge to local shelter and follow up with [facility].  Reason for Continuation of Hospitalization:  Depression Anxiety Medication Stabilization   Comments: N/A  Estimated length of stay: 1-2 days  For review of initial/current patient goals, please see plan of care. "Alexander MaltaChristopher Krichbaum is an 38 y.o. male who [facility] calling a suicide crisis line after he played "Russioan Roulette" with his friend's gun 2x this am, and they said they would IVC him if he did not come in. Pt says that he has access to knives at his home. He says he just wasn't thinking due to his bipolar and he was impulsive. He has multiple past attempts. Pt says he has had increasing depression recently, sleeps only 3 hrs/night, has lost 50 lbs in the past year. Pt works part time for a "real estate guy", and moved from a supported living environment to an apartment by himself. He say he does not like the area and it is depressing to live there, and he is afraid some of the time due to the neighborhood.  Pt denies HI, A/V hallucinations. [facility], but says he cannot always afford his medications, so he does not always take them. He has a hx of SA, but denies having a current problem with that now. [facility]."   Attendees: Patient:    Family:    Physician: Dr. Jama Flavorsobos; Dr. Dub MikesLugo 05/29/2014 9:30 AM  Nursing: Lendell CapriceBrittany Guthrie, RN 05/29/2014 9:30 AM  Clinical Social Worker: Belenda CruiseKristin Khalid Lacko,  LCSWA 05/29/2014 9:30 AM  Other: Juline PatchQuylle Hodnett, LCSW 05/29/2014 9:30 AM  Other: Leisa LenzValerie Enoch, [facility] Liaison 05/29/2014 9:30 AM  Other: Onnie BoerJennifer Clark, Case Manager 05/29/2014 9:30 AM  Other: Santa GeneraAnne Cunningham, LCSW 05/29/2014 9:30 AM  Other:     Scribe for Treatment Team:  Samuella BruinKristin Elma Shands, MSW, LCSWA [phone]

## 2014-05-29 NOTE — [facility] Group Notes (Signed)
   [facility] Note  05/29/2014  8:45 AM   Participation Quality: Alert, Appropriate and Oriented  Mood/Affect: Depressed and Flat  Depression Rating: 7/8  Anxiety Rating: 10  Thoughts of Suicide: Pt denies SI/HI  Will you contract for safety? Yes  Current AVH: Pt denies  Plan for Discharge/Comments: Pt attended discharge planning group and actively participated in group. CSW provided pt with today's workbook. Patient reports experiencing high levels of depression and anxiety. He reports that his plan is to discharge back to shelter and [facility].  Transportation Means: Pt reports access to transportation- will need bus pass  Supports: No supports mentioned at this time  Samuella BruinKristin Afua Hoots, MSW, [employer]CSWA Clinical Social Worker [facility] [phone]

## 2014-05-29 NOTE — Progress Notes (Signed)
Pt has been up and active in the milieu today.  He was up to be discharged today but told his doctor that he didn't think he was ready to leave today.  His discharge should be Monday here [facility]. He rated both his depression and hopelessness a 8 and anxiety a 10 on his self-inventory.  He requested a prn vistaril 25 mg at 1306 for his anxiety which he reported relief.  He denied any S/H ideation or A/V/H.

## 2014-05-30 DIAGNOSIS — F1021 Alcohol dependence, in remission: Secondary | ICD-10-CM

## 2014-05-30 NOTE — Progress Notes (Signed)
D) Pt has been attending the groups and interacting with his peers. Quite supportive in group and interacting appropriately. Pt rates his depression, helplessness and anxiety all at a 3 and denies SI and HI. Pt states that he is feeling sleepy and is trying his best to stay awake and be involved in the groups and the activities of the unit. A) Given support, reassurance and praise along with encouragement. Provided with a 1:1 and given positive feedback for his ability to express himself. R) Pt denies SI and HI.

## 2014-05-30 NOTE — [facility] Group Notes (Signed)
[facility] LCSW Group Therapy 05/30/2014 10:00am  Type of Therapy and Topic: Group Therapy: Avoiding Self-Sabotaging and Enabling Behaviors   Participation Level: Active   Description of Group:  Learn how to identify obstacles, self-sabotaging and enabling behaviors, what are they, why do we do them and what needs do these behaviors meet? Discuss unhealthy relationships and how to have positive healthy boundaries with those that sabotage and enable. Explore aspects of self-sabotage and enabling in yourself and how to limit these self-destructive behaviors in everyday life. A scaling question is used to help patient look at where they are now in their motivation to change, from 1 to 10 (lowest to highest motivation).   Therapeutic Goals:  1. Patient will identify one obstacle that relates to self-sabotage and enabling behaviors 2. Patient will identify one personal self-sabotaging or enabling behavior they did prior to admission 3. Patient able to establish a plan to change the above identified behavior they did prior to admission:  4. Patient will demonstrate ability to communicate their needs through discussion and/or role plays.  Summary of Patient Progress:  Pt participated in group discussion but missed a large portion due to meeting with the doctor. Pt participated in group discussion about motivation for self-sabotaging behaviors and the needs that they meet. He was also able to verbalize understanding of his own self-sabotaging behaviors. He did not identify an areas that he would like to change at discharge to help overcome these self-sabotaging behaviors.  Therapeutic Modalities:  Cognitive Behavioral Therapy  Person-Centered Therapy  Motivational Interviewing   Alexander CordialLauren [name], LCSWA 05/30/2014 2:58 PM

## 2014-05-30 NOTE — Plan of Care (Signed)
Problem: Alteration in thought process Goal: LTG-Patient has not harmed self or others in at least 2 days Outcome: Progressing Pt has not harmed himself or anyone else

## 2014-05-30 NOTE — Progress Notes (Signed)
Psychoeducational Group Note  Date: 05/30/2014 Time:  1315 Group Topic/Focus:  Identifying Needs:   The focus of this group is to help patients identify their personal needs that have been historically problematic and identify healthy behaviors to address their needs.  Participation Level:  Active  Participation Quality:  Appropriate  Affect:  Appropriate  Cognitive:  Oriented  Insight:  Improving  Engagement in Group:  Engaged  Additional Comments:  Pt was quite attentive and participated in the group.  Florrie Ramires A 

## 2014-05-30 NOTE — Progress Notes (Signed)
Patient ID: Alexander [name], male   DOB: 06-29-1975, 38 y.o.   MRN: [id number] [facility]  05/30/2014 10:46 AM Alexander [name]  MRN:  [id number] Subjective:    Chart reviewed. Pt interviewed. Patient reports feeling better today. Pt sleeping better (7 hours) with seroquel. Increased prozac dose is helpful for depression. Pt has found it helpful to talk to hospital staff. Overall feels less anxious, since recent groups have not discussed any triggers of his childhood trauma. .  Objective:  Denies medication side effects. No disruptive behaviors on unit and going to groups.   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 rate 14, height 5' 8.5" (1.74 m), weight 81.194 kg (179 lb).Body mass index is 26.82 kg/(m^2).  General Appearance: improved grooming  Eye Contact::  Good  Speech:  Normal Rate  Volume:  Normal  Mood:  "better"-  Affect:  Congruent and Restricted  Thought Process:  Goal Directed and Linear  Orientation:  Full (Time, Place, and Person)  Thought Content:  no hallucinations, no delusions-   Suicidal Thoughts:  No   Homicidal Thoughts:  No  Memory: Recent and Remote grossly intact  Judgement:  Fair  Insight:  Fair  Psychomotor Activity:  Normal  Concentration:  Fair  Recall:  Good  Fund of Knowledge:Good  Language: Good  Akathisia:  Negative  Handed:  Right  AIMS (if indicated):     Assets:  Desire for Improvement Resilience  Sleep:  Number of Hours: 6.75   Musculoskeletal: Strength & Muscle Tone: within  normal limits Gait & Station: normal Patient leans: N/A  Current Medications: Current Facility-Administered Medications  Medication Dose Route Frequency Provider Last Rate Last Dose  . acetaminophen (TYLENOL) tablet 650 mg  650 mg Oral Q6H PRN Lindwood QuaSheila May Agustin, NP      . alum & mag hydroxide-simeth (MAALOX/MYLANTA) 200-200-20 MG/5ML suspension 30 mL  30 mL Oral Q4H PRN Lindwood QuaSheila May Agustin, NP      . feeding supplement (ENSURE COMPLETE) (ENSURE COMPLETE) liquid 237 mL  237 mL Oral BID BM Tilda FrancoLindsey Baker, RD   237 mL at 05/29/14 1334  . FLUoxetine (PROZAC) capsule 40 mg  40 mg Oral Daily Rockey SituFernando A Cobos, MD   40 mg at 05/30/14 0830  . hydrOXYzine (ATARAX/VISTARIL) tablet 25 mg  25 mg Oral Q6H PRN Sanjuana KavaAgnes I Nwoko, NP   25 mg at 05/29/14 1306  . magnesium hydroxide (MILK OF MAGNESIA) suspension 30 mL  30 mL Oral Daily PRN Velna HatchetSheila May Agustin, NP      . nicotine (NICODERM CQ - dosed in mg/24 hours) patch 21 mg  21 mg Transdermal Daily Craige CottaFernando A Cobos, MD   21 mg at 05/30/14 0834  . QUEtiapine (SEROQUEL) tablet 25 mg  25 mg Oral q morning - 10a Craige CottaFernando A Cobos, MD   25 mg at 05/29/14 0933  . QUEtiapine (SEROQUEL) tablet 350 mg  350 mg Oral QHS Craige CottaFernando A Cobos, MD   350 mg at 05/29/14 2146    Lab Results:  No results found for this or any previous  visit (from the past 48 hour(s)).  Physical Findings: AIMS: Facial and Oral Movements Muscles of Facial Expression: None, normal Lips and Perioral Area: None, normal Jaw: None, normal Tongue: None, normal,Extremity Movements Upper (arms, wrists, hands, fingers): None, normal Lower (legs, knees, ankles, toes): None, normal, Trunk Movements Neck, shoulders, hips: None, normal, Overall Severity Incapacitation due to abnormal movements: None, normal Patient's awareness of abnormal movements (rate only patient's report): No Awareness, Dental Status Current problems with teeth and/or dentures?: No Does patient usually wear dentures?: No  CIWA:  CIWA-Ar  Total: 0 COWS:     Treatment Plan Summary: Daily contact with patient to assess and evaluate symptoms and progress in treatment Medication management See below   Assessment: Pt looking forward to going to homeless shelter soon.  Plan: Continue inpatient treatment , milieu, support Seroquel  25 mgrs QAM and 350 mgrs QHS Prozac  40 mgrs QAM Medical Decision Making Problem Points:  Established problem, stable/improving (1), Review of last therapy session (1) and Review of psycho-social stressors (1) Data Points:  Review of medication regiment & side effects (2) Review of new medications or change in dosage (2)  I certify that inpatient services furnished can reasonably be expected to improve the patient's condition.   Ancil LinseySARANGA, Nallely Yost 05/30/2014, 10:46 AM

## 2014-05-30 NOTE — Progress Notes (Signed)
.  Psychoeducational Group Note    Date: 05/30/2014 Time:  0930    Goal Setting Purpose of Group: To be able to set [name] goal that is measurable and that can be accomplished in one day Participation Level:  Active  Participation Quality:  Appropriate  Affect:  Flat  Cognitive:  Oriented  Insight:  Improving  Engagement in Group:  Engaged  Additional Comments:  Pt attended the group and was engaged  Alexander [name]  

## 2014-05-30 NOTE — Plan of Care (Signed)
Problem: Ineffective individual coping Goal: STG: Patient will remain free from self harm Outcome: Progressing Patient has remained free from self harm as evidenced by 15 min checks.     

## 2014-05-31 NOTE — Progress Notes (Signed)
Patient has been up and active on the unit, attended group this evening and has voiced no complaints. Patient currently denies having pain, -si/hi/a/v hall. Support and encouragement offered, safety maintained on unit, will continue to monitor.  

## 2014-05-31 NOTE — Progress Notes (Signed)
Patient attended AA meeting. Tonight we had a speaker meeting. They actively listened. He was bright this evening. He interacted with his peers and watched a football game in the dayroom. He is pleasant and friendly.   Alexander [name], [name] A

## 2014-05-31 NOTE — Plan of Care (Signed)
Problem: Alteration in thought process Goal: LTG-Patient has not harmed self or others in at least 2 days Outcome: Progressing Patient has not harmed self or others in at least 2 days.     

## 2014-05-31 NOTE — Progress Notes (Signed)
Patient has been up and active on the unit, attended group this evening and has voiced no complaints.He is looking forward to possible discharge on tomorrow. Patient currently denies having pain, -si/hi/a/v hall. Support and encouragement offered, safety maintained on unit, will continue to monitor.

## 2014-05-31 NOTE — [facility] Group Notes (Signed)
[facility] LCSW Group Therapy 05/31/2014 10:00am  Type of Therapy: Group Therapy- Feelings Around Discharge & Establishing a Supportive Framework  Participation Level: Active   Modes of Intervention: Clarification, Confrontation, Discussion, Education, Exploration, Limit-setting, Orientation, Problem-solving, Rapport Building, Dance movement psychotherapisteality Testing, Socialization and Support   Description of Group:   What is a supportive framework? What does it look like feel like and how do I discern it from and unhealthy non-supportive network? Learn how to cope when supports are not helpful and don't support you. Discuss what to do when your family/friends are not supportive.  Summary of Patient Progress Pt participated in group discussion and was able to verbalize characteristics of positive and unhealthy relationships. Pt reported that he was ready to deal with the challenges that discharge will bring. He identified his faith as a supportive factor. Pt discussed how enabling relationships are detrimental to his recovery and also identified enabling "things" such as negative environments.   Therapeutic Modalities:   Cognitive Behavioral Therapy Person-Centered Therapy Motivational Interviewing   Chad CordialLauren Carter, LCSWA 05/31/2014 11:07 AM

## 2014-05-31 NOTE — [facility] Group Notes (Signed)
[facility] Group Notes:  relaxation  Date:  05/31/2014  Time:  11:54 AM  Type of Therapy:  Nurse Education  Participation Level:  Did Not Attend  Participation Quality:  Inattentive  Affect:  Flat  Cognitive:  Lacking  Insight:  None  Engagement in Group:  Lacking  Modes of Intervention:  Discussion  Summary of Progress/Problems:  Nicole CellaWebb, Yeilin Zweber Guyes 05/31/2014, 11:54 AM

## 2014-05-31 NOTE — Progress Notes (Signed)
Patient ID: Alexander [name], male   DOB: [birth year], [age] y.o.   MRN: [id number] [facility]  05/31/2014 11:42 AM Alexander [name]  MRN:  [id number] Subjective:    Chart reviewed. Pt interviewed. Patient reports still feeling "good". Pt sleeping better (7 hours) with seroquel. Increased prozac dose is helpful for depression. Pt has found it helpful to talk to hospital staff. Overall feels less anxious. Participating in groups. .  Objective:  Denies medication side effects. No disruptive behaviors on unit and going to groups.   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 rate 16, height 5' 8.5" (1.74 m), weight 81.194 kg (179 lb).Body mass index is 26.82 kg/(m^2).  General Appearance: improved grooming  Eye Contact::  Good  Speech:  Normal Rate  Volume:  Normal  Mood:  "better"-  Affect:  Congruent and Restricted  Thought Process:  Goal Directed and Linear  Orientation:  Full (Time, Place, and Person)  Thought Content:  no hallucinations, no delusions-   Suicidal Thoughts:  No   Homicidal Thoughts:  No  Memory: Recent and Remote grossly intact  Judgement:  Fair  Insight:  Fair  Psychomotor Activity:  Normal  Concentration:  Fair  Recall:  Good  Fund of Knowledge:Good  Language: Good  Akathisia:  Negative  Handed:  Right  AIMS (if indicated):     Assets:  Desire for Improvement Resilience  Sleep:  Number of Hours: 6.75   Musculoskeletal: Strength & Muscle Tone: within normal limits Gait & Station: normal Patient leans:  N/A  Current Medications: Current Facility-Administered Medications  Medication Dose Route Frequency Provider Last Rate Last Dose  . acetaminophen (TYLENOL) tablet 650 mg  650 mg Oral Q6H PRN Lindwood QuaSheila May Agustin, NP      . alum & mag hydroxide-simeth (MAALOX/MYLANTA) 200-200-20 MG/5ML suspension 30 mL  30 mL Oral Q4H PRN Lindwood QuaSheila May Agustin, NP      . feeding supplement (ENSURE COMPLETE) (ENSURE COMPLETE) liquid 237 mL  237 mL Oral BID BM Tilda FrancoLindsey Baker, RD   237 mL at 05/30/14 1535  . FLUoxetine (PROZAC) capsule 40 mg  40 mg Oral Daily Craige CottaFernando A Cobos, MD   40 mg at 05/31/14 0825  . hydrOXYzine (ATARAX/VISTARIL) tablet 25 mg  25 mg Oral Q6H PRN Sanjuana KavaAgnes I Nwoko, NP   25 mg at 05/29/14 1306  . magnesium hydroxide (MILK OF MAGNESIA) suspension 30 mL  30 mL Oral Daily PRN Velna HatchetSheila May Agustin, NP      . nicotine (NICODERM CQ - dosed in mg/24 hours) patch 21 mg  21 mg Transdermal Daily Craige CottaFernando A Cobos, MD   21 mg at 05/31/14 0827  . QUEtiapine (SEROQUEL) tablet 25 mg  25 mg Oral q morning - 10a Craige CottaFernando A Cobos, MD   25 mg at 05/31/14 [id number]  . QUEtiapine (SEROQUEL) tablet 350 mg  350 mg Oral QHS Craige CottaFernando A Cobos, MD   350 mg at 05/30/14 2204    Lab Results:  No results found for this or any previous visit (from the past 48 hour(s)).  Physical Findings: AIMS: Facial  and Oral Movements Muscles of Facial Expression: None, normal Lips and Perioral Area: None, normal Jaw: None, normal Tongue: None, normal,Extremity Movements Upper (arms, wrists, hands, fingers): None, normal Lower (legs, knees, ankles, toes): None, normal, Trunk Movements Neck, shoulders, hips: None, normal, Overall Severity Severity of abnormal movements (highest score from questions above): None, normal Incapacitation due to abnormal movements: None, normal Patient's awareness of abnormal movements (rate only patient's report): No Awareness, Dental Status Current problems with teeth and/or dentures?: No Does patient usually wear  dentures?: No  CIWA:  CIWA-Ar Total: 0 COWS:     Treatment Plan Summary: Daily contact with patient to assess and evaluate symptoms and progress in treatment Medication management See below   Assessment: Pt looking forward to going to homeless shelter soon.  Plan: Continue inpatient treatment , milieu, support Seroquel  25 mgrs QAM and 350 mgrs QHS Prozac  40 mgrs QAM Medical Decision Making Problem Points:  Established problem, stable/improving (1), Review of last therapy session (1) and Review of psycho-social stressors (1) Data Points:  Review of medication regiment & side effects (2) Review of new medications or change in dosage (2)  I certify that inpatient services furnished can reasonably be expected to improve the patient's condition.   Ancil LinseySARANGA, Shareef Eddinger 05/31/2014, 11:42 AM

## 2014-05-31 NOTE — Progress Notes (Signed)
D) Pt has attended the groups and interacts with his peers. Affect and mood are appropriate. Rates his depression, hopelessness and helplessness at a 0 and denies SI and HI. States he feels that he is doing well and has learned much from the program that has been offered here A) Pt given support, reassurance and praise, along with encouragement. Provided with a 1:1. R) Denies SI and HI

## 2014-05-31 NOTE — [facility] Group Notes (Signed)
[facility] Group Notes:  Healthy Life Skills  Date:  05/31/2014  Time:  2:58 PM  Type of Therapy:  Nurse Education  Participation Level:  Active  Participation Quality:  Appropriate  Affect:  Appropriate  Cognitive:  Appropriate  Insight:  Appropriate  Engagement in Group:  Engaged  Modes of Intervention:  Discussion  Summary of Progress/Problems:  Alexander [name], Alexander [name] 05/31/2014, 2:58 PM

## 2014-06-01 DIAGNOSIS — F3130 Bipolar disorder, current episode depressed, mild or moderate severity, unspecified: Principal | ICD-10-CM

## 2014-06-01 MED ORDER — FLUOXETINE HCL 40 MG PO CAPS: 40.0000 mg | ORAL_CAPSULE | Freq: Every day | ORAL | Status: DC

## 2014-06-01 MED ORDER — QUETIAPINE FUMARATE 50 MG PO TABS: 350.0000 mg | ORAL_TABLET | Freq: Every day | ORAL | Status: DC

## 2014-06-01 MED ORDER — HYDROXYZINE HCL 25 MG PO TABS: 25.0000 mg | ORAL_TABLET | Freq: Four times a day (QID) | ORAL | Status: DC | PRN

## 2014-06-01 MED ORDER — QUETIAPINE FUMARATE 50 MG PO TABS
350.0000 mg | ORAL_TABLET | Freq: Every day | ORAL | Status: DC
  Filled 2014-06-01: qty 1

## 2014-06-01 MED ORDER — NICOTINE 21 MG/24HR TD PT24: 21.0000 mg | MEDICATED_PATCH | Freq: Every day | TRANSDERMAL | Status: AC

## 2014-06-01 MED ORDER — QUETIAPINE FUMARATE 100 MG PO TABS
350.0000 mg | ORAL_TABLET | Freq: Every day | ORAL | Status: DC
  Filled 2014-06-01: qty 49

## 2014-06-01 MED ORDER — QUETIAPINE FUMARATE 25 MG PO TABS: 25.0000 mg | ORAL_TABLET | Freq: Every morning | ORAL | Status: DC

## 2014-06-01 NOTE — Progress Notes (Signed)
Pt was discharged home today. He denied any S/I H/I or A/V hallucinations.  He was given f/u appointment, rx, sample medications, hotline info booklet, and letter provided by the case manager.  He did accept nicotine patches and information to remain tobacco free.  He voiced understanding to all instructions provided.

## 2014-06-01 NOTE — B[facility] Suicide Risk Assessment (Signed)
Demographic Factors:  38 year old caucasian male, single, homeless , employed   Total Time spent with patient: 30 minutes  Psychiatric Specialty Exam: Physical Exam  ROS  Blood pressure 121/84, pulse 90, temperature 98.1 F (36.7 C), temperature source Oral, resp. rate 20, height 5' 8.5" (1.74 m), weight 81.194 kg (179 lb).Body mass index is 26.82 kg/(m^2).  General Appearance: improved grooming  Eye Contact::  Good  Speech:  Normal Rate  Volume:  Normal  Mood:  improved, less depressed, today stable, affect more reactive, less anxious  Affect:  Full Range  Thought Process:  Goal Directed and Linear  Orientation:  Full (Time, Place, and Person)  Thought Content:  no hallucinations,no delusions  Suicidal Thoughts:  No  Homicidal Thoughts:  No  Memory:  recent and remote grossly intact  Judgement:  Other:  improved  Insight:  Present  Psychomotor Activity:  Normal  Concentration:  Good  Recall:  Good  Fund of Knowledge:Good  Language: Good  Akathisia:  Negative  Handed:  Right  AIMS (if indicated):     Assets:  Desire for Improvement Physical Health Resilience Talents/Skills  Sleep:  Number of Hours: 6.75    Musculoskeletal: Strength & Muscle Tone: within normal limits Gait & Station: normal Patient leans: N/A   Mental Status Per Nursing Assessment::   On Admission:  Suicidal ideation indicated by patient, Suicidal ideation indicated by others, Suicide plan  Current Mental Status by Physician: At this time patient is is much improved compared to admission. Mood is improved, affect is improved, less anxious, no thought disorder, no suicidal or homicidal ideations, no psychotic symptoms. Behavior calm and in good control.  Future oriented- for example plans [place] in [place].  Loss Factors: Homelessness, children living with mother out of state, limited support network.  Historical Factors: History of Depression, remote history of self  cutting, history of suicide attempts, history of alcohol abuse, now in remission.   Risk Reduction Factors:   Sense of responsibility to family, Employed and Positive coping skills or problem solving skills  Continued Clinical Symptoms:   As noted above, patient is currently improved, with improved mood, improved range of affect, no thought disorder, no SI or HI, no psychotic symptoms, future oriented.  Cognitive Features That Contribute To Risk:  No gross cognitive deficits noted upon discharge. Is alert , attentive, and oriented x 3   Suicide Risk:  Mild:  Suicidal ideation of limited frequency, intensity, duration, and specificity.  There are no identifiable plans, no associated intent, mild dysphoria and related symptoms, good self-control (both objective and subjective assessment), few other risk factors, and identifiable protective factors, including available and accessible social support.  Discharge Diagnoses:   AXIS I: 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 Chronic back pain   . Bipolar disorder   . Anxiety   . Alcoholism /alcohol abuse   . Heroin abuse   . Suicide attempt     cut self   AXIS IV:  housing problems, other psychosocial or environmental problems and problems related to social environment AXIS V: 60-65 upon discharge   Plan Of Care/Follow-up recommendations:  Activity:  As tolerated Diet:  Regular Tests:  NA Other:  see below  Is patient on multiple antipsychotic therapies at discharge:  No   Has Patient had three or more  failed trials of antipsychotic monotherapy by history:  No  Recommended Plan for Multiple Antipsychotic Therapies: NA  Patient is leaving unit in good spirits. He is going to [facility]. [facility]. [facility].    Specialty:  Behavioral Health   Why: Please present to [facility] Monday-Friday between 8am to 3 pm for assessment for therapy and medication management services. Also, when you see Roxanne today who will pcik you up, ask her about ACT team services with [facility]   Contact information:   [address] [place] [address] [phone]     Nehemiah MassedCOBOS, FERNANDO 06/01/2014, 11:59 AM

## 2014-06-01 NOTE — Plan of Care (Signed)
Problem: Ineffective individual coping Goal: STG: Patient will remain free from self harm Outcome: Progressing Patient has remained free from harm.     

## 2014-06-01 NOTE — Progress Notes (Addendum)
[facility] Discharge [facility] :  Will you be returning to the same living situation after discharge: No. States he will go to [facility], even with no bed guarantee, as he works for [employer] and knows they will put him on the floor there. At discharge, do you have transportation home?:Yes,  [facility] transition team Do you have the ability to pay for your medications:Yes,  mental health  Release of information consent forms completed and in the chart;  Patient's signature needed at discharge.  Patient to Follow up at: Follow-up Information    [facility] with [facility].   Specialty:  Behavioral Health   Why:  Please present to [facility] Monday-Friday between 8am to 3 pm for assessment for therapy and medication management services. Also, when you see Roxanne today who will pcik you up, ask her about ACT team services with [facility]   Contact information:   [address] [place] [address] [phone]       Patient denies SI/HI:   Yes,  yes    Safety Planning and Suicide Prevention discussed:  Yes,  yes  Ida Rogueorth, Annalyn Blecher B 06/01/2014, 10:59 AM

## 2014-06-01 NOTE — Tx Team (Signed)
  Interdisciplinary Treatment Plan Update   Date Reviewed:  06/01/2014  Time Reviewed:  10:55 AM  Progress in Treatment:   Attending groups: Yes Participating in groups: Yes Taking medication as prescribed: Yes  Tolerating medication: Yes Family/Significant other contact made: Yes  Patient understands diagnosis: Yes  Discussing patient identified problems/goals with staff: Yes  See initial care plan Medical problems stabilized or resolved: Yes Denies suicidal/homicidal ideation: Yes  In tx team Patient has not harmed self or others: Yes  For review of initial/current patient goals, please see plan of care.  Estimated Length of Stay:  D/C today  Reason for Continuation of Hospitalization:   New Problems/Goals identified:  N/A  Discharge Plan or Barriers:   return to shelter, [facility]  Additional Comments:  Attendees:  Signature: Geoffery LyonsIrving Lugo, MD 06/01/2014 10:55 AM   Signature: Richelle Itood Deaysia Grigoryan, LCSW 06/01/2014 10:55 AM  Signature: 06/01/2014 10:55 AM  Signature: Robbie LouisVivian Kent, RN 06/01/2014 10:55 AM  Signature:  06/01/2014 10:55 AM  Signature:  06/01/2014 10:55 AM  Signature:   06/01/2014 10:55 AM  Signature:    Signature:    Signature:    Signature:    Signature:    Signature:      Scribe for Treatment Team:   Richelle Itood Alcie Runions, LCSW  06/01/2014 10:55 AM

## 2014-06-01 NOTE — Clinical Social Work Note (Signed)
Spoke with Cordelia PenSherry at the [facility] who informed me that pt is eligible to come there, and that they have an opening today.

## 2014-06-01 NOTE — [facility] Group Notes (Signed)
[facility] LCSW [facility] Discharge Planning Group Note   06/01/2014 10:49 AM    Participation Quality:  Appropraite  Mood/Affect:  Appropriate  Depression Rating:  4  Anxiety Rating:  10  Thoughts of Suicide:  No  Will you contract for safety?   NA  Current AVH:  No  Plan for Discharge/Comments:  Patient attended discharge planning group and actively participated in group. He reports increased depression is due to discharging today.  Suicide prevention education reviewed and SPE document provided.   Transportation Means: Patient uses public transportation.   Supports:  Patient has a limited support system.   Rainen Vanrossum, Joesph JulyQuylle Hairston

## 2014-06-01 NOTE — Plan of Care (Signed)
Problem: Alteration in mood Goal: STG-Patient reports thoughts of self-harm to staff Outcome: Completed/Met Date Met:  06/01/14 Pt denies any S/I and is planning to leave later this afternoon.

## 2014-06-03 NOTE — Progress Notes (Signed)
Patient Discharge Instructions:  After Visit Summary (AVS):   Faxed to:  06/03/14 Psychiatric Admission Assessment Note:   Faxed to:  06/03/14 Suicide Risk Assessment - Discharge Assessment:   Faxed to:  06/03/14 Faxed/Sent to the Next Level Care provider:  06/03/14 Faxed to [facility] @ [phone]  Jerelene ReddenSheena E Fleming, 06/03/2014, 3:47 PM

## 2014-06-04 ENCOUNTER — Encounter (HOSPITAL_COMMUNITY): Payer: Self-pay | Admitting: Emergency Medicine

## 2014-06-04 ENCOUNTER — Observation Stay (HOSPITAL_COMMUNITY)
Admission: EM | Admit: 2014-06-04 | Discharge: 2014-06-10 | Payer: No Typology Code available for payment source | Attending: Internal Medicine | Admitting: Internal Medicine

## 2014-06-04 DIAGNOSIS — F4310 Post-traumatic stress disorder, unspecified: Secondary | ICD-10-CM | POA: Insufficient documentation

## 2014-06-04 DIAGNOSIS — T43592A Poisoning by other antipsychotics and neuroleptics, intentional self-harm, initial encounter: Principal | ICD-10-CM | POA: Insufficient documentation

## 2014-06-04 DIAGNOSIS — F1120 Opioid dependence, uncomplicated: Secondary | ICD-10-CM | POA: Diagnosis present

## 2014-06-04 DIAGNOSIS — T1491XA Suicide attempt, initial encounter: Secondary | ICD-10-CM | POA: Diagnosis present

## 2014-06-04 DIAGNOSIS — F17210 Nicotine dependence, cigarettes, uncomplicated: Secondary | ICD-10-CM | POA: Insufficient documentation

## 2014-06-04 DIAGNOSIS — Z8249 Family history of ischemic heart disease and other diseases of the circulatory system: Secondary | ICD-10-CM | POA: Insufficient documentation

## 2014-06-04 DIAGNOSIS — F1210 Cannabis abuse, uncomplicated: Secondary | ICD-10-CM | POA: Insufficient documentation

## 2014-06-04 DIAGNOSIS — T50902A Poisoning by unspecified drugs, medicaments and biological substances, intentional self-harm, initial encounter: Secondary | ICD-10-CM

## 2014-06-04 DIAGNOSIS — F1920 Other psychoactive substance dependence, uncomplicated: Secondary | ICD-10-CM | POA: Insufficient documentation

## 2014-06-04 DIAGNOSIS — F3130 Bipolar disorder, current episode depressed, mild or moderate severity, unspecified: Secondary | ICD-10-CM | POA: Diagnosis present

## 2014-06-04 DIAGNOSIS — F419 Anxiety disorder, unspecified: Secondary | ICD-10-CM | POA: Insufficient documentation

## 2014-06-04 DIAGNOSIS — F1524 Other stimulant dependence with stimulant-induced mood disorder: Secondary | ICD-10-CM | POA: Insufficient documentation

## 2014-06-04 DIAGNOSIS — T1491 Suicide attempt: Secondary | ICD-10-CM

## 2014-06-04 DIAGNOSIS — T50901A Poisoning by unspecified drugs, medicaments and biological substances, accidental (unintentional), initial encounter: Secondary | ICD-10-CM | POA: Diagnosis present

## 2014-06-04 DIAGNOSIS — F332 Major depressive disorder, recurrent severe without psychotic features: Secondary | ICD-10-CM | POA: Insufficient documentation

## 2014-06-04 DIAGNOSIS — M549 Dorsalgia, unspecified: Secondary | ICD-10-CM | POA: Insufficient documentation

## 2014-06-04 DIAGNOSIS — Y9289 Other specified places as the place of occurrence of the external cause: Secondary | ICD-10-CM | POA: Insufficient documentation

## 2014-06-04 DIAGNOSIS — G8929 Other chronic pain: Secondary | ICD-10-CM | POA: Insufficient documentation

## 2014-06-04 LAB — COMPREHENSIVE METABOLIC PANEL
ALK PHOS: 166 U/L — AB (ref 39–117)
ALT: 16 U/L (ref 0–53)
ALT: 15 U/L (ref 0–53)
ANION GAP: 13 (ref 5–15)
AST: 13 U/L (ref 0–37)
AST: 13 U/L (ref 0–37)
Albumin: 3.4 g/dL — ABNORMAL LOW (ref 3.5–5.2)
Albumin: 3.4 g/dL — ABNORMAL LOW (ref 3.5–5.2)
Alkaline Phosphatase: 168 U/L — ABNORMAL HIGH (ref 39–117)
Anion gap: 13 (ref 5–15)
BILIRUBIN TOTAL: 0.3 mg/dL (ref 0.3–1.2)
BILIRUBIN TOTAL: 0.4 mg/dL (ref 0.3–1.2)
BUN: 9 mg/dL (ref 6–23)
BUN: 8 mg/dL (ref 6–23)
CHLORIDE: 101 meq/L (ref 96–112)
CHLORIDE: 101 meq/L (ref 96–112)
CO2: 25 meq/L (ref 19–32)
CO2: 28 mEq/L (ref 19–32)
CREATININE: 0.78 mg/dL (ref 0.50–1.35)
Calcium: 9.1 mg/dL (ref 8.4–10.5)
Calcium: 9.4 mg/dL (ref 8.4–10.5)
Creatinine, Ser: 0.83 mg/dL (ref 0.50–1.35)
GFR calc non Af Amer: >90 mL/min (ref >90)
GLUCOSE: 147 mg/dL — AB (ref 70–99)
GLUCOSE: 95 mg/dL (ref 70–99)
Potassium: 3.7 mEq/L (ref 3.7–5.3)
Potassium: 3.5 mEq/L — ABNORMAL LOW (ref 3.7–5.3)
Sodium: 139 mEq/L (ref 137–147)
Sodium: 142 mEq/L (ref 137–147)
TOTAL PROTEIN: 6.7 g/dL (ref 6.0–8.3)
Total Protein: 6.8 g/dL (ref 6.0–8.3)

## 2014-06-04 LAB — CBC
HEMATOCRIT: 34.2 % — AB (ref 39.0–52.0)
HEMOGLOBIN: 11.8 g/dL — AB (ref 13.0–17.0)
MCH: 29.1 pg (ref 26.0–34.0)
MCHC: 34.5 g/dL (ref 30.0–36.0)
MCV: 84.4 fL (ref 78.0–100.0)
PLATELETS: 236 10*3/uL (ref 150–400)
RBC: 4.05 MIL/uL — AB (ref 4.22–5.81)
RDW: 14.1 % (ref 11.5–15.5)
WBC: 6.7 10*3/uL (ref 4.0–10.5)

## 2014-06-04 LAB — RAPID URINE DRUG SCREEN, HOSP PERFORMED
AMPHETAMINES: NOT DETECTED
BENZODIAZEPINES: NOT DETECTED
Barbiturates: NOT DETECTED
Cocaine: POSITIVE — AB
Opiates: POSITIVE — AB
Tetrahydrocannabinol: POSITIVE — AB

## 2014-06-04 LAB — ETHANOL

## 2014-06-04 LAB — ACETAMINOPHEN LEVEL
Acetaminophen (Tylenol), Serum: <15 ug/mL (ref 10–30)
Acetaminophen (Tylenol), Serum: <15 ug/mL (ref 10–30)

## 2014-06-04 LAB — MAGNESIUM
MAGNESIUM: 2.1 mg/dL (ref 1.5–2.5)
Magnesium: 2.5 mg/dL (ref 1.5–2.5)

## 2014-06-04 LAB — SALICYLATE LEVEL: Salicylate Lvl: <2 mg/dL — ABNORMAL LOW (ref 2.8–20.0)

## 2014-06-04 LAB — PHOSPHORUS: PHOSPHORUS: 3.5 mg/dL (ref 2.3–4.6)

## 2014-06-04 MED ORDER — CHARCOAL ACTIVATED PO LIQD
50.0000 g | Freq: Once | ORAL | Status: AC
  Administered 2014-06-04: 50 g via ORAL
  Filled 2014-06-04: qty 240

## 2014-06-04 MED ORDER — HYDROMORPHONE HCL 1 MG/ML IJ SOLN: 1.0000 mg | INTRAMUSCULAR | Status: DC | PRN

## 2014-06-04 MED ORDER — NICOTINE 21 MG/24HR TD PT24
21.0000 mg | MEDICATED_PATCH | Freq: Every day | TRANSDERMAL | Status: DC
Start: 2014-06-04 — End: 2014-06-10
  Administered 2014-06-04 – 2014-06-10 (×7): 21 mg via TRANSDERMAL
  Filled 2014-06-04 (×7): qty 1

## 2014-06-04 MED ORDER — SODIUM CHLORIDE 0.9 % IJ SOLN
3.0000 mL | Freq: Two times a day (BID) | INTRAMUSCULAR | Status: DC
  Administered 2014-06-05 – 2014-06-07 (×3): 3 mL via INTRAVENOUS

## 2014-06-04 MED ORDER — ONDANSETRON HCL 4 MG/2ML IJ SOLN: 4.0000 mg | Freq: Four times a day (QID) | INTRAMUSCULAR | Status: DC | PRN

## 2014-06-04 MED ORDER — HYDROXYZINE HCL 25 MG PO TABS
25.0000 mg | ORAL_TABLET | Freq: Four times a day (QID) | ORAL | Status: DC | PRN
Start: 2014-06-04 — End: 2014-06-10
  Filled 2014-06-04: qty 1

## 2014-06-04 MED ORDER — ACETAMINOPHEN 325 MG PO TABS
650.0000 mg | ORAL_TABLET | Freq: Four times a day (QID) | ORAL | Status: DC | PRN
  Administered 2014-06-06 – 2014-06-07 (×3): 650 mg via ORAL
  Filled 2014-06-04 (×3): qty 2

## 2014-06-04 MED ORDER — ENOXAPARIN SODIUM 40 MG/0.4ML ~~LOC~~ SOLN
40.0000 mg | SUBCUTANEOUS | Status: DC
  Administered 2014-06-06 – 2014-06-09 (×4): 40 mg via SUBCUTANEOUS
  Filled 2014-06-04 (×7): qty 0.4

## 2014-06-04 MED ORDER — ONDANSETRON HCL 4 MG PO TABS: 4.0000 mg | ORAL_TABLET | Freq: Four times a day (QID) | ORAL | Status: DC | PRN

## 2014-06-04 MED ORDER — SODIUM CHLORIDE 0.9 % IV SOLN
INTRAVENOUS | Status: DC
  Administered 2014-06-04 – 2014-06-05 (×2): via INTRAVENOUS

## 2014-06-04 MED ORDER — HYDROCODONE-ACETAMINOPHEN 5-325 MG PO TABS
1.0000 | ORAL_TABLET | Freq: Four times a day (QID) | ORAL | Status: DC | PRN
Start: 2014-06-04 — End: 2014-06-10
  Administered 2014-06-06 – 2014-06-10 (×16): 1 via ORAL
  Filled 2014-06-04 (×16): qty 1

## 2014-06-04 MED ORDER — MAGNESIUM SULFATE IN D5W 10-5 MG/ML-% IV SOLN
1.0000 g | Freq: Once | INTRAVENOUS | Status: AC
  Administered 2014-06-04: 1 g via INTRAVENOUS
  Filled 2014-06-04: qty 100

## 2014-06-04 MED ORDER — ACETAMINOPHEN 650 MG RE SUPP
650.0000 mg | Freq: Four times a day (QID) | RECTAL | Status: DC | PRN
Start: 2014-06-04 — End: 2014-06-10

## 2014-06-04 NOTE — ED Notes (Signed)
Poison Control recommends 50 g activated charcoal without sorbitol in the next 50 minutes or not at all. Expect: dry mouth, dizziness, somnolence, mild tacchycardia, CNS depression, anticholinergic effects of tacchycardia and urinary retention, tonic clonic seizures, acute T wave prolongation, respiratory depression. Labs: acetaminophen, magnesium, liver enzymes, start fluids, monitor for 6 hours or until asymptomatic. Repeat EKG if any symptoms done prior to medically clearing him. Replace potassium and magnesium if low.
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
## 2014-06-04 NOTE — ED Notes (Signed)
Pt is IVC 

## 2014-06-04 NOTE — ED Notes (Signed)
Patient has a lighter that security has. Patient also has one bag of belongings in locker 26.

## 2014-06-04 NOTE — ED Provider Notes (Signed)
CSN: [id number]     Arrival date & time 06/04/14  1338 History   First MD Initiated Contact with Patient 06/04/14 1357     No chief complaint on file.    (Consider location/radiation/quality/duration/timing/severity/associated sxs/prior Treatment) Patient is a 38 y.o. male presenting with Overdose. The history is provided by the patient.  Drug Overdose This is a new problem. The current episode started 1 to 2 hours ago. The problem occurs constantly. The problem has not changed since onset.Pertinent negatives include no chest pain, no abdominal pain and no shortness of breath. Nothing aggravates the symptoms. Nothing relieves the symptoms.    Past Medical History  Diagnosis Date  . Chronic back pain   . Bipolar disorder   . Anxiety   . Alcoholism /alcohol abuse   . Heroin abuse   . Suicide attempt     cut self   Past Surgical History  Procedure Laterality Date  . Hernia repair     Family History  Problem Relation Age of Onset  . Hypertension Other    History  Substance Use Topics  . Smoking status: Current Every Day Smoker -- 1.00 packs/day    Types: Cigarettes  . Smokeless tobacco: Never Used  . Alcohol Use: Yes    Review of Systems  Constitutional: Negative for fever and chills.  Respiratory: Negative for shortness of breath.   Cardiovascular: Negative for chest pain and leg swelling.  Gastrointestinal: Negative for vomiting and abdominal pain.  All other systems reviewed and are negative.     Allergies  Review of patient's allergies indicates no known allergies.  Home Medications   Prior to Admission medications   Medication Sig Start Date End Date Taking? Authorizing Provider  FLUoxetine (PROZAC) 40 MG capsule Take 1 capsule (40 mg total) by mouth daily. 06/01/14   Lindwood QuaSheila May Agustin, NP  hydrOXYzine (ATARAX/VISTARIL) 25 MG tablet Take 1 tablet (25 mg total) by mouth every 6 (six) hours as needed for anxiety (sleep). 06/01/14   Velna HatchetSheila May Agustin, NP   nicotine (NICODERM CQ - DOSED IN MG/24 HOURS) 21 mg/24hr patch Place 1 patch (21 mg total) onto the skin daily. 06/01/14   Velna HatchetSheila May Agustin, NP  QUEtiapine (SEROQUEL) 25 MG tablet Take 1 tablet (25 mg total) by mouth every morning. 06/01/14   Velna HatchetSheila May Agustin, NP  QUEtiapine (SEROQUEL) 50 MG tablet Take 7 tablets (350 mg total) by mouth at bedtime. 06/01/14   Lindwood QuaSheila May Agustin, NP  traZODone (DESYREL) 100 MG tablet Take 1 tablet (100 mg total) by mouth at bedtime as needed for sleep. 01/26/14   Verne SpurrNeil Mashburn, PA-C   BP 111/68 mmHg  Pulse 103  Temp(Src) 98.5 F (36.9 C) (Oral)  Resp 14  SpO2 95% Physical Exam  Constitutional: He is oriented to person, place, and time. He appears well-developed and well-nourished. No distress.  HENT:  Head: Normocephalic and atraumatic.  Mouth/Throat: No oropharyngeal exudate.  Eyes: EOM are normal. Pupils are equal, round, and reactive to light.  Neck: Normal range of motion. Neck supple.  Cardiovascular: Normal rate and regular rhythm.  Exam reveals no friction rub.   No murmur heard. Pulmonary/Chest: Effort normal and breath sounds normal. No respiratory distress. He has no wheezes. He has no rales.  Abdominal: Soft. He exhibits no distension. There is no tenderness. There is no rebound.  Musculoskeletal: Normal range of motion. He exhibits no edema.  Neurological: He is alert and oriented to person, place, and time.  Somnolent but arousable  Skin:  Skin is warm. No rash noted. He is not diaphoretic. No erythema.  Nursing note and vitals reviewed.   ED Course  Procedures (including critical care time) Labs Review Labs Reviewed  CBC - Abnormal; Notable for the following:    RBC 4.05 (*)    Hemoglobin 11.8 (*)    HCT 34.2 (*)    All other components within normal limits  COMPREHENSIVE METABOLIC PANEL - Abnormal; Notable for the following:    Glucose, Bld 147 (*)    Albumin 3.4 (*)    Alkaline Phosphatase 168 (*)    All other components  within normal limits  SALICYLATE LEVEL - Abnormal; Notable for the following:    Salicylate Lvl <2.0 (*)    All other components within normal limits  ETHANOL  ACETAMINOPHEN LEVEL  URINE RAPID DRUG SCREEN (HOSP PERFORMED)  MAGNESIUM  PHOSPHORUS    Imaging Review No results found.   EKG Interpretation   Date/Time:  Thursday June 04 2014 13:55:47 EST Ventricular Rate:  102 PR Interval:  134 QRS Duration: 83 QT Interval:  385 QTC Calculation: 501 R Axis:   96 Text Interpretation:  Sinus tachycardia Borderline right axis deviation  Prolonged QT interval Prolonged QT new Confirmed by Gwendolyn GrantWALDEN  MD, Kiante Ciavarella  ([id number]) on 06/04/2014 2:07:36 PM      MDM   Final diagnoses:  Overdose, intentional self-harm, initial encounter    42M here s/p a drug overdose/suicide attempt. Took a large amount of Seroquel. IVC placed. QT mildly prolonged (501 ms). Patient with be observed for 6 hours. I spoke with Poison Control - with his prolonged QT, will need overnight admission for serial lab work.  Elwin MochaBlair Akiba Melfi, MD 06/04/14 [phone]

## 2014-06-04 NOTE — Plan of Care (Signed)
Problem: Phase I Progression Outcomes Goal: Pain controlled with appropriate interventions Outcome: Completed/Met Date Met:  06/04/14 Goal: OOB as tolerated unless otherwise ordered Outcome: Completed/Met Date Met:  06/04/14 Goal: Initial discharge plan identified Outcome: Completed/Met Date Met:  06/04/14 Goal: Voiding-avoid urinary catheter unless indicated Outcome: Completed/Met Date Met:  06/04/14 Goal: Hemodynamically stable Outcome: Completed/Met Date Met:  06/04/14     

## 2014-06-04 NOTE — ED Notes (Addendum)
Pt states he took approximately 15-20 tablets of Seroquel 100 mg around 1245, pt states he also took an additional 5 tablets of Seroquel 100 mg around 1200. Pt appears drowsy, denies nausea, c/o abdominal pains and dizziness. Pt states this was a suicide attempt, states he had stayed clean for months from alcohol but relapsed yesterday, feels worthless and like "a waste of time."

## 2014-06-05 DIAGNOSIS — F1994 Other psychoactive substance use, unspecified with psychoactive substance-induced mood disorder: Secondary | ICD-10-CM

## 2014-06-05 DIAGNOSIS — F4310 Post-traumatic stress disorder, unspecified: Secondary | ICD-10-CM

## 2014-06-05 DIAGNOSIS — R45851 Suicidal ideations: Secondary | ICD-10-CM

## 2014-06-05 DIAGNOSIS — F1920 Other psychoactive substance dependence, uncomplicated: Secondary | ICD-10-CM

## 2014-06-05 LAB — COMPREHENSIVE METABOLIC PANEL
ALT: 14 U/L (ref 0–53)
AST: 11 U/L (ref 0–37)
Albumin: 3.1 g/dL — ABNORMAL LOW (ref 3.5–5.2)
Alkaline Phosphatase: 155 U/L — ABNORMAL HIGH (ref 39–117)
Anion gap: 10 (ref 5–15)
BUN: 9 mg/dL (ref 6–23)
CO2: 25 mEq/L (ref 19–32)
CREATININE: 0.9 mg/dL (ref 0.50–1.35)
Calcium: 9.2 mg/dL (ref 8.4–10.5)
Chloride: 106 mEq/L (ref 96–112)
GFR calc Af Amer: >90 mL/min (ref >90)
GFR calc non Af Amer: >90 mL/min (ref >90)
Glucose, Bld: 90 mg/dL (ref 70–99)
Potassium: 4.3 mEq/L (ref 3.7–5.3)
Sodium: 141 mEq/L (ref 137–147)
Total Bilirubin: 0.3 mg/dL (ref 0.3–1.2)
Total Protein: 6.4 g/dL (ref 6.0–8.3)

## 2014-06-05 LAB — HEMOGLOBIN A1C
Hgb A1c MFr Bld: 5.3 % (ref <5.7)
Mean Plasma Glucose: 105 mg/dL (ref <117)

## 2014-06-05 LAB — CBC
HEMATOCRIT: 34.8 % — AB (ref 39.0–52.0)
HEMOGLOBIN: 11.7 g/dL — AB (ref 13.0–17.0)
MCH: 28.8 pg (ref 26.0–34.0)
MCHC: 33.6 g/dL (ref 30.0–36.0)
MCV: 85.7 fL (ref 78.0–100.0)
Platelets: 240 10*3/uL (ref 150–400)
RBC: 4.06 MIL/uL — ABNORMAL LOW (ref 4.22–5.81)
RDW: 14.4 % (ref 11.5–15.5)
WBC: 6.5 10*3/uL (ref 4.0–10.5)

## 2014-06-05 MED ORDER — FLUOXETINE HCL 20 MG PO CAPS
40.0000 mg | ORAL_CAPSULE | Freq: Every day | ORAL | Status: DC
Start: 2014-06-05 — End: 2014-06-09
  Administered 2014-06-05 – 2014-06-09 (×5): 40 mg via ORAL
  Filled 2014-06-05 (×5): qty 2

## 2014-06-05 MED ORDER — TRAZODONE HCL 100 MG PO TABS
100.0000 mg | ORAL_TABLET | Freq: Every day | ORAL | Status: DC
  Administered 2014-06-05: 100 mg via ORAL
  Filled 2014-06-05 (×2): qty 1

## 2014-06-05 NOTE — Discharge Summary (Signed)
Physician Discharge Summary Note  Patient:  Alexander MaltaChristopher [name] is an 38 y.o., male MRN:  [id number] DOB:  05/16/1976 Patient phone:  [phone] (home)  Patient address:   [address]  [place] [address],  Total Time spent with patient: 30 minutes  Date of Admission:  05/25/2014 Date of Discharge: 06/01/2014  Reason for Admission:  Depression with suicidal ideation  Discharge Diagnoses:  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 Psychiatric: His speech is normal and behavior is normal. Judgment and thought content normal. His mood appears anxious. Cognition and memory are normal.    Review of Systems  Constitutional: Negative.   HENT: Negative.   Eyes: Negative.   Respiratory: Negative.   Cardiovascular: Negative.   Gastrointestinal: Negative.   Genitourinary: Negative.   Musculoskeletal: Negative.   Skin: Negative.   Neurological: Negative.   Endo/Heme/Allergies: Negative.   Psychiatric/Behavioral: Positive for depression (hx of, chronic). Negative for suicidal ideas, hallucinations, memory loss and substance abuse. The patient is nervous/anxious. The patient does not have insomnia.     Blood pressure 121/84, pulse 90, temperature 98.1 F (36.7 C), temperature source Oral, resp. rate 20, height 5' 8.5" (1.74 m), weight 81.194 kg (179 lb).Body mass index is 26.82 kg/(m^2).    Musculoskeletal: Strength & Muscle Tone: within normal limits Gait & Station: normal Patient leans: N/A  Past Psychiatric History: Diagnosis: History of Depression- states he has been diagnosed with Bipolar Disorder in the past. At this time does not endorse history of mania. Does describe brief mood swings.   Hospitalizations:  (+) prior psychiatric admissions, most recently 7-8/15. ( for depression)   Outpatient Care: [facility]   [facility]: [facility]  Self-Mutilation: (+) history of self cutting, none in several years   Suicidal Attempts: (+) history of overdosing On heroin years ago  Violent Behaviors: denies violence    Discharge Diagnoses:  AXIS I: 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 Chronic back pain   . Bipolar disorder   . Anxiety   . Alcoholism /alcohol abuse   . Heroin abuse   . Suicide attempt     cut self   AXIS IV: housing problems, other psychosocial or environmental problems and problems related to social environment AXIS V: 60-65 upon discharge   Level of Care:  OP  Hospital Course:  38 year old man, known to our staff from recent ( July 28- August 3/15) admission. He states he had stopped taking his prescribed psychiatric medications over the last several weeks because " I could not afford them". He states he had been staying at a local shelter, " but I did not even have a bed so I ended up sleeping on the floor". In the context of these stressors he became progressively depressed and recently developed suicidal ideations. He has a friend who has a [employer] and he decided to play russian roulette with it. After this he called a suicide hot line and his friends insisted he come to hospital.  He stated he continues to feel very depressed, sad, anxious, irritable. Patient has a prior history of alcohol dependence and  opiate abuse- he states he has not been drinking. He has been taking percocet occasionally - no recent regular drug use.   Alexander [name] was admitted for inpatient treatment for crisis stabilization.  There was gradual improvement observed during his stay.   At time of discharge, he rated both depression and anxiety levels to be manageable and minimal.  In group therapy, there was participation from patient as observed by staff.  He tolerated Seroquel and Prozac well with out reports of adverse side effects.  Denied physiological concerns/SI/HI/AVH at time of discharge.  Patient was encouraged to adhere to medication compliance and outpatient treatment to maximize recovery and prevent future relapses.   Consults:  psychiatry  Significant Diagnostic Studies:  labs: per   Discharge Vitals:   Blood pressure 121/84, pulse 90, temperature 98.1 F (36.7 C), temperature source Oral, resp. rate 20, height 5' 8.5" (1.74 m), weight 81.194 kg (179 lb). Body mass index is 26.82 kg/(m^2). Lab Results:   No results found for this or any previous visit (from the past 72 hour(s)).  Physical Findings: AIMS: Facial and Oral Movements Muscles of Facial Expression: None, normal Lips and Perioral Area: None, normal Jaw: None, normal Tongue: None, normal,Extremity Movements Upper (arms, wrists, hands, fingers): None, normal Lower (legs, knees, ankles, toes): None, normal, Trunk Movements Neck, shoulders, hips: None, normal, Overall Severity Severity of abnormal movements (highest score from questions above): None, normal Incapacitation due to abnormal movements: None, normal Patient's awareness of abnormal movements (rate only patient's report): No Awareness, Dental Status Current problems with teeth and/or dentures?: No Does patient usually wear dentures?: No  CIWA:  CIWA-Ar Total: 0 COWS:     Psychiatric Specialty Exam: See Psychiatric Specialty Exam and Suicide Risk Assessment completed by Attending Physician prior to discharge.  Discharge destination:  Home  Is patient on multiple antipsychotic therapies at discharge:  No   Has Patient had three or more failed trials of antipsychotic monotherapy by history:  No  Recommended Plan for Multiple  Antipsychotic Therapies: NA     Medication List    STOP taking these medications        ARIPiprazole 5 MG tablet  Commonly known as:  ABILIFY     FLUoxetine 20 MG tablet  Commonly known as:  PROZAC  Replaced by:  FLUoxetine 40 MG capsule      TAKE these medications      Indication   FLUoxetine 40 MG capsule  Commonly known as:  PROZAC  Take 1 capsule (40 mg total) by mouth daily.   Indication:  Depression, Major Depressive Disorder     hydrOXYzine 25 MG tablet  Commonly known as:  ATARAX/VISTARIL  Take 1 tablet (25 mg total) by mouth every 6 (six) hours as needed for anxiety (sleep).   Indication:  Tension, Sleep     nicotine 21 mg/24hr patch  Commonly known as:  NICODERM CQ - dosed in mg/24 hours  Place 1 patch (21 mg total) onto the skin daily.   Indication:  Nicotine Addiction     QUEtiapine 25 MG tablet  Commonly known as:  SEROQUEL  Take 1 tablet (25 mg total) by mouth every morning.   Indication:  Mood Stabilization     QUEtiapine 50 MG tablet  Commonly known as:  SEROQUEL  Take 7 tablets (350 mg total) by mouth at bedtime.   Indication:  Mood Stabilization     traZODone 100 MG tablet  Commonly known as:  DESYREL  Take 1 tablet (100 mg total)  by mouth at bedtime as needed for sleep.   Indication:  Trouble Sleeping           Follow-up Information    Follow up with [facility].   Specialty:  Behavioral Health   Why:  Please present to [facility] Monday-Friday between 8am to 3 pm for assessment for therapy and medication management services. Also, when you see Ms Billy Coast today who will pick you up,[or one of her team members], ask her about ACT team services with [facility] information:   [address] [place] [address] [phone]       Follow-up recommendations:  Activity:  as tol, diet as tol  Comments:  1.  Take all your medications as prescribed.              2.  Report any adverse side effects to outpatient provider.                        3.  Patient instructed to not use alcohol or illegal drugs while on prescription medicines.            4.  In the event of worsening symptoms, instructed patient to call 911, the crisis hotline or go to nearest emergency room for evaluation of symptoms.  Total Discharge Time:  Greater than 30 minutes.  SignedAdonis Brook MAY, AGNP-BC 06/05/2014, 9:26 AM   Patient seen, Suicide Assessment Completed.  Disposition Plan Reviewed
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
## 2014-06-05 NOTE — Progress Notes (Signed)
UR completed 
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
## 2014-06-05 NOTE — Progress Notes (Signed)
CARE MANAGEMENT NOTE 06/05/2014  Patient:  Alexander [name],Alexander [name]   Account Number:  [id number]  Date Initiated:  06/05/2014  Documentation initiated by:  Trinna BalloonMcGIBBONEY,Alexander Alexander [name]  Subjective/Objective Assessment:   Pt admitted with Overdose     Action/Plan:   from home   Anticipated DC Date:  06/06/2014   Anticipated DC Plan:  PSYCHIATRIC HOSPITAL  In-house referral  Clinical Social Worker      DC Planning Services  CM consult  Medication Assistance      Choice offered to / List presented to:             Status of service:  In process, will continue to follow Medicare Important Message given?   (If response is "NO", the following Medicare IM given date fields will be blank) Date Medicare IM given:   Medicare IM given by:   Date Additional Medicare IM given:   Additional Medicare IM given by:    Discharge Disposition:  PSYCHIATRIC HOSPITAL  Per UR Regulation:  Reviewed for med. necessity/level of care/duration of stay  If discussed at Long Length of Stay Meetings, dates discussed:    Comments:  06/05/14 MMcGibboney, RN, BSN Pt admitted with cco Seroquel overdose as suicide attempt and polysubstance intoxication.

## 2014-06-05 NOTE — Progress Notes (Signed)
Clinical Social Work  Per MD, patient is medically stable to DC. CSW contacted the following facilities:  [facility]- available beds. Referral faxed.  [facility]- AC Minerva Areola(Eric) reports he will call CSW if bed is available  [facility] Ref[facility] fax[facility].  [facility]- available beds. Referral faxed.  [facility]- no available beds  [facility]- [facility] beds  [facility] Referral faxed.  [facility]- available beds. Referral faxed.   [facility]- no available beds  [facility]. Referral faxed.  [facility]- no available beds  [facility]- [facility] beds. Referral faxed.  [facility]- wait list but referral faxed.  [facility]  [facility]- no available beds  [facility]- no [facility] beds  [facility]- no [facility] available  [facility]- [facility]  [facility]- available beds. Referral faxed.  CSW will continue to follow to assist with placement.  ElberfeldHolly Tylea Hise, Kentucky[place]CSW [phone]

## 2014-06-06 DIAGNOSIS — T50903A Poisoning by unspecified drugs, medicaments and biological substances, assault, initial encounter: Secondary | ICD-10-CM

## 2014-06-06 LAB — CBC
HEMATOCRIT: 38.2 % — AB (ref 39.0–52.0)
HEMOGLOBIN: 12.9 g/dL — AB (ref 13.0–17.0)
MCH: 28.9 pg (ref 26.0–34.0)
MCHC: 33.8 g/dL (ref 30.0–36.0)
MCV: 85.5 fL (ref 78.0–100.0)
Platelets: 280 10*3/uL (ref 150–400)
RBC: 4.47 MIL/uL (ref 4.22–5.81)
RDW: 14 % (ref 11.5–15.5)
WBC: 7.7 10*3/uL (ref 4.0–10.5)

## 2014-06-06 LAB — COMPREHENSIVE METABOLIC PANEL
ALT: 12 U/L (ref 0–53)
AST: 11 U/L (ref 0–37)
Albumin: 3.6 g/dL (ref 3.5–5.2)
Alkaline Phosphatase: 177 U/L — ABNORMAL HIGH (ref 39–117)
Anion gap: 15 (ref 5–15)
BILIRUBIN TOTAL: 0.2 mg/dL — AB (ref 0.3–1.2)
BUN: 8 mg/dL (ref 6–23)
CHLORIDE: 104 meq/L (ref 96–112)
CO2: 22 mEq/L (ref 19–32)
Calcium: 9.6 mg/dL (ref 8.4–10.5)
Creatinine, Ser: 0.84 mg/dL (ref 0.50–1.35)
GFR calc Af Amer: >90 mL/min (ref >90)
GFR calc non Af Amer: >90 mL/min (ref >90)
Glucose, Bld: 101 mg/dL — ABNORMAL HIGH (ref 70–99)
Potassium: 3.8 mEq/L (ref 3.7–5.3)
Sodium: 141 mEq/L (ref 137–147)
Total Protein: 7.3 g/dL (ref 6.0–8.3)

## 2014-06-06 MED ORDER — SODIUM CHLORIDE 0.9 % IV SOLN
250.0000 mL | INTRAVENOUS | Status: DC | PRN
Start: 2014-06-06 — End: 2014-06-10

## 2014-06-06 MED ORDER — TRAZODONE HCL 150 MG PO TABS
150.0000 mg | ORAL_TABLET | Freq: Every day | ORAL | Status: DC
  Administered 2014-06-06 – 2014-06-09 (×4): 150 mg via ORAL
  Filled 2014-06-06 (×6): qty 1

## 2014-06-06 MED ORDER — SODIUM CHLORIDE 0.9 % IJ SOLN
3.0000 mL | INTRAMUSCULAR | Status: DC | PRN
  Administered 2014-06-08 (×2): 3 mL via INTRAVENOUS
  Filled 2014-06-06 (×2): qty 3

## 2014-06-06 MED ORDER — SODIUM CHLORIDE 0.9 % IJ SOLN
3.0000 mL | Freq: Two times a day (BID) | INTRAMUSCULAR | Status: DC
  Administered 2014-06-06 – 2014-06-10 (×6): 3 mL via INTRAVENOUS

## 2014-06-06 NOTE — Progress Notes (Signed)
PATIENT DETAILS Name: Alexander [name] Age: 38 y.o. Sex: male Date of Birth: [birth year] Admit Date: 06/04/2014 Admitting Physician Ripudeep Jenna LuoK Rai, MD PCP:No PCP Per Patient  Brief summary  Patient is a 38 year old male with a history of depression, PTSD, prior suicidal attempts who presented to the ED on 12/10 with a Seroquel overdose. Patient was given activated charcoal, monitored in the telemetry unit, subsequently seen by psychiatry who has  recommended transfer to inpatient psychiatric treatment of bed is available. Currently doing well without any major complaints  Subjective: No major complaints  Assessment/Plan: Principal Problem:   Overdose: Intentional overdose with Seroquel: Stable on telemetry, EKG with improving QTC. Seen by psychiatric, recommended inpatient transfer. Stable for inpatient transfer to psychiatry once bed available. Continue to monitor closely, continue sitter.  Active Problems:   MDD (major depressive disorder), recurrent severe, without psychosis: Seroquel on hold, already on Prozac and trazodone which is being continued.  History of tobacco abuse: Continue transdermal nicotine.  Marijuana abuse: Denies cocaine use-claims marijuana was likely laced with cocaine. Have counseled  Disposition: Remain inpatient  Antibiotics:  None   Anti-infectives    None      DVT Prophylaxis: Prophylactic Lovenox   Code Status: Full code   Family Communication None at bedside  Procedures:  NONE  CONSULTS:  psychiatry  MEDICATIONS: Scheduled Meds: . enoxaparin (LOVENOX) injection  40 mg Subcutaneous Q24H  . FLUoxetine  40 mg Oral Daily  . nicotine  21 mg Transdermal Daily  . sodium chloride  3 mL Intravenous Q12H  . sodium chloride  3 mL Intravenous Q12H  . traZODone  100 mg Oral QHS   Continuous Infusions:  PRN Meds:.sodium chloride, acetaminophen **OR** acetaminophen, HYDROcodone-acetaminophen, HYDROmorphone (DILAUDID)  injection, hydrOXYzine, ondansetron **OR** ondansetron (ZOFRAN) IV, sodium chloride    PHYSICAL EXAM: Vital signs in last 24 hours: Filed Vitals:   06/05/14 0531 06/05/14 1331 06/05/14 2133 06/06/14 0606  BP: 109/77 131/90 135/94 130/91  Pulse: 64 81 73 71  Temp: 97.6 F (36.4 C) 98.2 F (36.8 C) 98.8 F (37.1 C) 98.1 F (36.7 C)  TempSrc: Oral Oral Oral Oral  Resp: 18 18 16 16   Height:      Weight:      SpO2: 97% 99% 99% 100%    Weight change:  Filed Weights   06/04/14 1750  Weight: 88.905 kg (196 lb)   Body mass index is 28.12 kg/(m^2).   Gen Exam: Awake and alert with clear speech.   Neck: Supple, No JVD.   Chest: B/L Clear.   CVS: S1 S2 Regular, no murmurs.  Abdomen: soft, BS +, non tender, non distended.  Extremities: no edema, lower extremities warm to touch. Neurologic: Non Focal.   Skin: No Rash.   Wounds: N/A.   Intake/Output from previous day:  Intake/Output Summary (Last 24 hours) at 06/06/14 1130 Last data filed at 06/06/14 1045  Gross per 24 hour  Intake 4041.67 ml  Output      0 ml  Net 4041.67 ml     LAB RESULTS: CBC  Recent Labs Lab 06/04/14 1418 06/05/14 0355 06/06/14 0404  WBC 6.7 6.5 7.7  HGB 11.8* 11.7* 12.9*  HCT 34.2* 34.8* 38.2*  PLT 236 240 280  MCV 84.4 85.7 85.5  MCH 29.1 28.8 28.9  MCHC 34.5 33.6 33.8  RDW 14.1 14.4 14.0    Chemistries   Recent Labs Lab 06/04/14 1418 06/04/14 1916 06/05/14 0355 06/06/14 0404  NA 139  142 141 141  K 3.7 3.5* 4.3 3.8  CL 101 101 106 104  CO2 25 28 25 22   GLUCOSE 147* 95 90 101*  BUN 9 8 9 8   CREATININE 0.78 0.83 0.90 0.84  CALCIUM 9.1 9.4 9.2 9.6  MG 2.1 2.5  --   --     CBG: No results for input(s): GLUCAP in the last 168 hours.  GFR Estimated Creatinine Clearance: 131.3 mL/min (by C-G formula based on Cr of 0.84).  Coagulation profile No results for input(s): INR, PROTIME in the last 168 hours.  Cardiac Enzymes No results for input(s): CKMB, TROPONINI, MYOGLOBIN  in the last 168 hours.  Invalid input(s): CK  Invalid input(s): POCBNP No results for input(s): DDIMER in the last 72 hours.  Recent Labs  06/04/14 1923  HGBA1C 5.3   No results for input(s): CHOL, HDL, LDLCALC, TRIG, CHOLHDL, LDLDIRECT in the last 72 hours. No results for input(s): TSH, T4TOTAL, T3FREE, THYROIDAB in the last 72 hours.  Invalid input(s): FREET3 No results for input(s): VITAMINB12, FOLATE, FERRITIN, TIBC, IRON, RETICCTPCT in the last 72 hours. No results for input(s): LIPASE, AMYLASE in the last 72 hours.  Urine Studies No results for input(s): UHGB, CRYS in the last 72 hours.  Invalid input(s): UACOL, UAPR, USPG, UPH, UTP, UGL, UKET, UBIL, UNIT, UROB, ULEU, UEPI, UWBC, URBC, UBAC, CAST, UCOM, BILUA  MICROBIOLOGY: No results found for this or any previous visit (from the past 240 hour(s)).  RADIOLOGY STUDIES/RESULTS: Ct Renal Stone Study  05/24/2014   CLINICAL DATA:  Left flank pain for 2 days.  EXAM: CT ABDOMEN AND PELVIS WITHOUT CONTRAST  TECHNIQUE: Multidetector CT imaging of the abdomen and pelvis was performed following the standard protocol without IV contrast.  COMPARISON:  03/11/2014  FINDINGS: There are calcified granulomas in the liver and spleen, unchanged. Liver parenchyma is otherwise normal. Pancreas, adrenal glands, and kidneys are normal. There are no renal or ureteral calculi or bladder calculi. Bladder is normal. Prostate gland is normal. The bowel is normal including the terminal ileum and appendix. No free air or free fluid.  There are moderate arthritic changes of both hips, left worse than right. Flowing osteophytes fuse much of the thoracic and upper lumbar spine. There is diffuse facet arthritis throughout the lumbar spine.  IMPRESSION: No acute abnormality of the abdomen or pelvis. No renal or ureteral calculi. Diffuse degenerative changes in the spine.   Electronically Signed   By: Geanie CooleyJim  Maxwell M.D.   On: 05/24/2014 20:48    Jeoffrey MassedGHIMIRE,SHANKER,  MD  [facility] Pager:[phone]  If 7PM-7AM, please contact night-coverage [email or web address] Password [email or web address] 06/06/2014, 11:30 AM   LOS: 2 days

## 2014-06-06 NOTE — Progress Notes (Signed)
Utilization Review completed.  
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
## 2014-06-07 NOTE — Clinical Social Work Note (Signed)
   CSW contacted the following facilities to assess for bed availability  [place]  No beds  [facility] will put on shift report list  [facility] full try again tomorrow  [facility] short staffed could not look at pt yet  [facility] and [facility]ts are waiting in their ER  [facility]  [facility] said no because pt has had 3 inpatients and last one being Nov 30  [facility]  Could not get through to facility all day either phone problem or wrong number   CSW will continue to follow up  .Alexander Bubaegina Kerington Hildebrant, LCSW [facility] Clinical Social Worker - Weekend Coverage cell #: [phone]

## 2014-06-07 NOTE — Progress Notes (Signed)
[facility] Progress Note        PATIENT DETAILS Name: Alexander MaltaChristopher [name] Age: 38 y.o. Sex: male Date of Birth: 02/21/1976 Admit Date: 06/04/2014 Admitting Physician Ripudeep Jenna LuoK Rai, MD PCP:No PCP Per Patient  HPI  Patient is a 38 year old male with a history of depression, PTSD, prior suicidal attempts who presented to the ED on 12/10 with a Seroquel overdose. Patient was given activated charcoal, monitored in the telemetry unit, subsequently seen by psychiatry who has  recommended transfer to inpatient psychiatric treatment of bed is available. Currently doing well without any major complaints  Subjective: No complaints  Assessment/Plan:  Overdose: Intentional overdose with Seroquel: Stable on telemetry, EKG with improving QTC. Seen by psychiatric, recommended inpatient transfer. Stable for inpatient transfer to psychiatry once bed available. Continue to monitor closely, continue sitter.  MDD (major depressive disorder), recurrent severe, without psychosis: Seroquel on hold, already on Prozac and trazodone which is being continued, dose increased yesterday.  History of tobacco abuse: Continue transdermal nicotine.  Marijuana abuse: Denies cocaine use-claims marijuana was likely laced with cocaine. Have counseled    Disposition: Remain inpatient Antibiotics: none  DVT Prophylaxis: Prophylactic Lovenox   Code Status: Full code  Family Communication None at bedside  Procedures:  NONE  CONSULTS:  psychiatry   PHYSICAL EXAM: Vital signs in last 24 hours: Filed Vitals:   06/06/14 0606 06/06/14 1405 06/06/14 2054 06/07/14 0455  BP: 130/91 120/89 127/84 124/84  Pulse: 71 63 70 74  Temp: 98.1 F (36.7 C) 98 F (36.7 C) 98.1 F (36.7 C) 98.1 F (36.7 C)  TempSrc: Oral Oral Oral Oral  Resp: 16 18    Height:      Weight:      SpO2: 100% 98% 98% 97%    Weight change:  Filed Weights   06/04/14 1750  Weight: 88.905 kg (196 lb)   Body mass index is 28.12 kg/(m^2).    Gen Exam: Awake and alert with clear speech.   Neck: Supple, No JVD.   Chest: B/L Clear.   CVS: S1 S2 Regular, no murmurs.  Abdomen: soft, BS +, non tender, non distended.  Extremities: no edema, lower extremities warm to touch. Neurologic: Non Focal.   Skin: No Rash.    Intake/Output from previous day:  Intake/Output Summary (Last 24 hours) at 06/07/14 0757 Last data filed at 06/06/14 1550  Gross per 24 hour  Intake   1400 ml  Output      0 ml  Net   1400 ml   LAB RESULTS: CBC  Recent Labs Lab 06/04/14 1418 06/05/14 0355 06/06/14 0404  WBC 6.7 6.5 7.7  HGB 11.8* 11.7* 12.9*  HCT 34.2* 34.8* 38.2*  PLT 236 240 280  MCV 84.4 85.7 85.5  MCH 29.1 28.8 28.9  MCHC 34.5 33.6 33.8  RDW 14.1 14.4 14.0   Chemistries   Recent Labs Lab 06/04/14 1418 06/04/14 1916 06/05/14 0355 06/06/14 0404  NA 139 142 141 141  K 3.7 3.5* 4.3 3.8  CL 101 101 106 104  CO2 25 28 25 22   GLUCOSE 147* 95 90 101*  BUN 9 8 9 8   CREATININE 0.78 0.83 0.90 0.84  CALCIUM 9.1 9.4 9.2 9.6  MG 2.1 2.5  --   --    GFR Estimated Creatinine Clearance: 131.3 mL/min (by C-G formula based on Cr of 0.84).  Invalid input(s): POCBNP No results for input(s): DDIMER in the last 72 hours.  Recent Labs  06/04/14 1923  HGBA1C 5.3  RADIOLOGY STUDIES/RESULTS: Ct Renal Stone Study  05/24/2014  No acute abnormality of the abdomen or pelvis. No renal or ureteral calculi. Diffuse degenerative changes in the spine.    Scheduled Meds: . enoxaparin (LOVENOX) injection  40 mg Subcutaneous Q24H  . FLUoxetine  40 mg Oral Daily  . nicotine  21 mg Transdermal Daily  . sodium chloride  3 mL Intravenous Q12H  . sodium chloride  3 mL Intravenous Q12H  . traZODone  150 mg Oral QHS   Continuous Infusions:  PRN Meds:.sodium chloride, acetaminophen **OR** acetaminophen, HYDROcodone-acetaminophen, HYDROmorphone (DILAUDID) injection, hydrOXYzine, ondansetron **OR** ondansetron (ZOFRAN) IV, sodium  chloride  Time Spent: 15 minutes  Pamella PertGHERGHE, Elianis Fischbach, MD  [facility] Pager:[phone]  If 7PM-7AM, please contact night-coverage [email or web address] Password [email or web address] 06/07/2014, 7:57 AM   LOS: 3 days

## 2014-06-07 NOTE — Progress Notes (Signed)
UR completed 
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
## 2014-06-08 NOTE — Progress Notes (Signed)
Clinical Social Work  CSW continues to search for placement for patient. CSW contacted the following facilities:  [facility]- no available beds  [facility]- AC Minerva Areola(Eric) reports he will call CSW if bed is available  [facility]- [facility].  [facility]- no available beds  [facility]- no available beds  [facility]- no available beds  [facility]- admissions reports they lost referrals over the weekend and asked CSW to re-fax information.  [facility]- admissions reports they have not reviewed referral yet but will call CSW if they can accept patient.  [facility]- no available beds  [facility]- no [facility] beds  [facility]- no [facility] beds  [facility]- denied on 12/13  [facility]- patient placed on wait list on 12/12  [facility]- no [facility] beds  [facility]- no available beds  [facility]- no available beds  [facility]- no [place] beds available  [facility]- no available beds  [facility]- admissions reports available beds but they want updated information faxed. Clinicals faxed.  CSW will continue to follow.   [place] Stephie Xu, [place] [phone]

## 2014-06-08 NOTE — Progress Notes (Signed)
UR completed 

## 2014-06-08 NOTE — Progress Notes (Signed)
[facility] Progress Note        PATIENT DETAILS Name: Alexander MaltaChristopher Rosamond Age: 38 y.o. Sex: male Date of Birth: 12/03/1975 Admit Date: 06/04/2014 Admitting Physician Ripudeep Jenna LuoK Rai, MD PCP:No PCP Per Patient  HPI  Patient is a 38 year old male with a history of depression, PTSD, prior suicidal attempts who presented to the ED on 12/10 with a Seroquel overdose. Patient was given activated charcoal, monitored in the telemetry unit, subsequently seen by psychiatry who has  recommended transfer to inpatient psychiatric treatment of bed is available. Currently doing well without any major complaints  Subjective: No complaints, denies chest pain, breathing difficulties, he is eating well without GI complaints  Assessment/Plan:  Overdose: Intentional overdose with Seroquel: Stable on telemetry, EKG with improving QTC. Seen by psychiatric, recommended inpatient transfer. Stable for inpatient transfer to psychiatry once bed available. Continue to monitor closely, continue sitter.  MDD (major depressive disorder), recurrent severe, without psychosis: Seroquel on hold, already on Prozac and trazodone which is being continued, dose increased yesterday.  History of tobacco abuse: Continue transdermal nicotine.  Marijuana abuse: Denies cocaine use-claims marijuana was likely laced with cocaine. Have counseled    Disposition: Remain inpatient Antibiotics: none  DVT Prophylaxis: Prophylactic Lovenox   Code Status: Full code  Family Communication None at bedside  Procedures:  NONE  CONSULTS:  psychiatry   PHYSICAL EXAM: Vital signs in last 24 hours: Filed Vitals:   06/07/14 0455 06/07/14 1514 06/07/14 2143 06/08/14 0624  BP: 124/84 123/76 120/84 119/78  Pulse: 74 71 74 88  Temp: 98.1 F (36.7 C) 98.1 F (36.7 C) 98.1 F (36.7 C) 98.1 F (36.7 C)  TempSrc: Oral Oral Oral Oral  Resp:  18 18 18   Height:      Weight:      SpO2: 97% 97% 98% 97%    Weight change:  Filed  Weights   06/04/14 1750  Weight: 88.905 kg (196 lb)   Body mass index is 28.12 kg/(m^2).   Gen Exam: Awake and alert with clear speech.   Neck: Supple, No JVD.   Chest: B/L Clear.   CVS: S1 S2 Regular, no murmurs.  Extremities: no edema  Intake/Output from previous day:  Intake/Output Summary (Last 24 hours) at 06/08/14 1314 Last data filed at 06/08/14 1121  Gross per 24 hour  Intake   1080 ml  Output      0 ml  Net   1080 ml   LAB RESULTS: CBC  Recent Labs Lab 06/04/14 1418 06/05/14 0355 06/06/14 0404  WBC 6.7 6.5 7.7  HGB 11.8* 11.7* 12.9*  HCT 34.2* 34.8* 38.2*  PLT 236 240 280  MCV 84.4 85.7 85.5  MCH 29.1 28.8 28.9  MCHC 34.5 33.6 33.8  RDW 14.1 14.4 14.0   Chemistries   Recent Labs Lab 06/04/14 1418 06/04/14 1916 06/05/14 0355 06/06/14 0404  NA 139 142 141 141  K 3.7 3.5* 4.3 3.8  CL 101 101 106 104  CO2 25 28 25 22   GLUCOSE 147* 95 90 101*  BUN 9 8 9 8   CREATININE 0.78 0.83 0.90 0.84  CALCIUM 9.1 9.4 9.2 9.6  MG 2.1 2.5  --   --    GFR Estimated Creatinine Clearance: 131.3 mL/min (by C-G formula based on Cr of 0.84).  Invalid input(s): POCBNP No results for input(s): DDIMER in the last 72 hours. No results for input(s): HGBA1C in the last 72 hours.  RADIOLOGY STUDIES/RESULTS: Ct Renal Stone Study  05/24/2014  No  acute abnormality of the abdomen or pelvis. No renal or ureteral calculi. Diffuse degenerative changes in the spine.    Scheduled Meds: . enoxaparin (LOVENOX) injection  40 mg Subcutaneous Q24H  . FLUoxetine  40 mg Oral Daily  . nicotine  21 mg Transdermal Daily  . sodium chloride  3 mL Intravenous Q12H  . traZODone  150 mg Oral QHS   Continuous Infusions:  PRN Meds:.sodium chloride, acetaminophen **OR** acetaminophen, HYDROcodone-acetaminophen, HYDROmorphone (DILAUDID) injection, hydrOXYzine, ondansetron **OR** ondansetron (ZOFRAN) IV, sodium chloride  Time Spent: 15 minutes  Pamella PertGHERGHE, Antwion Carpenter, MD  [facility] Pager:[phone]  If 7PM-7AM, please contact night-coverage [email or web address] Password [email or web address] 06/08/2014, 1:14 PM   LOS: 4 days
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
## 2014-06-09 DIAGNOSIS — F1120 Opioid dependence, uncomplicated: Secondary | ICD-10-CM | POA: Diagnosis present

## 2014-06-09 DIAGNOSIS — F1920 Other psychoactive substance dependence, uncomplicated: Secondary | ICD-10-CM

## 2014-06-09 DIAGNOSIS — F11220 Opioid dependence with intoxication, uncomplicated: Secondary | ICD-10-CM

## 2014-06-09 MED ORDER — FLUOXETINE HCL 20 MG PO CAPS
60.0000 mg | ORAL_CAPSULE | Freq: Every day | ORAL | Status: DC
  Administered 2014-06-10: 60 mg via ORAL
  Filled 2014-06-09: qty 3

## 2014-06-09 NOTE — Progress Notes (Signed)
[facility] PROGRESS NOTE  Alexander [name] ZOX:[id number] DOB: [birth year] DOA: 06/04/2014 PCP: No PCP Per Patient  Brief narrative 38 year old male with history of major depression, PTSD, prior suicidal attempts presented to the ED with Seroquel overdose. He was given activated charcoal and monitored in the telemetry unit. Patient was seen by psychiatry who recommended inpatient psychiatry admission was medically clear and bed available. Patient currently stable and awaiting bed at inpatient psychiatry.  Assessment/Plan: Major depressive disorder with seroquel overdose Clinically stable. Continue to monitor on telemetry. Continue Software engineerbedside sitter. Monitor Qtc. Transfer to inpatient psychiatry once bed available. Patient had been unaccepted from several different facilities.  Major depressive disorder, recurrent severe, On Prozac and trazodone with those increased. Several cold has  Tobacco abuse   counseled on cessation. Nicotine patch  Marijuana abuse Counseled on cessation.  DVT prophylaxis: Subcutaneous Lovenox  diet: Regular  Code Status: Full code Family Communication: None Disposition Plan: Awaiting inpatient psych   Consultants:  Psychiatry  Procedures:  None  Antibiotics:  None  HPI/Subjective: Patient seen and examined. Denies any suicidal ideations. Feels unsafe to go home.  Objective: Filed Vitals:   06/09/14 1426  BP: 106/67  Pulse: 84  Temp: 98 F (36.7 C)  Resp: 18    Intake/Output Summary (Last 24 hours) at 06/09/14 1659 Last data filed at 06/09/14 1609  Gross per 24 hour  Intake    240 ml  Output      0 ml  Net    240 ml   Filed Weights   06/04/14 1750  Weight: 88.905 kg (196 lb)    Exam:   General:  Middle aged male in no acute distress  HEENT: Moist oral mucosa  Chest: Clear to auscultation bilaterally  CVS: Normal S1 and S2, no murmurs  Abdomen: Soft, nondistended, nontender  Extremities: Warm, no  edema    Data Reviewed: Basic Metabolic Panel:  Recent Labs Lab 06/04/14 1418 06/04/14 1916 06/05/14 0355 06/06/14 0404  NA 139 142 141 141  K 3.7 3.5* 4.3 3.8  CL 101 101 106 104  CO2 25 28 25 22   GLUCOSE 147* 95 90 101*  BUN 9 8 9 8   CREATININE 0.78 0.83 0.90 0.84  CALCIUM 9.1 9.4 9.2 9.6  MG 2.1 2.5  --   --   PHOS 3.5  --   --   --    Liver Function Tests:  Recent Labs Lab 06/04/14 1418 06/04/14 1916 06/05/14 0355 06/06/14 0404  AST 13 13 11 11   ALT 16 15 14 12   ALKPHOS 168* 166* 155* 177*  BILITOT 0.3 0.4 0.3 0.2*  PROT 6.8 6.7 6.4 7.3  ALBUMIN 3.4* 3.4* 3.1* 3.6   No results for input(s): LIPASE, AMYLASE in the last 168 hours. No results for input(s): AMMONIA in the last 168 hours. CBC:  Recent Labs Lab 06/04/14 1418 06/05/14 0355 06/06/14 0404  WBC 6.7 6.5 7.7  HGB 11.8* 11.7* 12.9*  HCT 34.2* 34.8* 38.2*  MCV 84.4 85.7 85.5  PLT 236 240 280   Cardiac Enzymes: No results for input(s): CKTOTAL, CKMB, CKMBINDEX, TROPONINI in the last 168 hours. BNP (last 3 results) No results for input(s): PROBNP in the last 8760 hours. CBG: No results for input(s): GLUCAP in the last 168 hours.  No results found for this or any previous visit (from the past 240 hour(s)).   Studies: No results found.  Scheduled Meds: . enoxaparin (LOVENOX) injection  40 mg Subcutaneous Q24H  . [START ON 06/10/2014] FLUoxetine  60 mg Oral Daily  . nicotine  21 mg Transdermal Daily  . sodium chloride  3 mL Intravenous Q12H  . traZODone  150 mg Oral QHS   Continuous Infusions:      Time spent: 25 minutes    Quorra Rosene  [facility] Pager [phone] If 7PM-7AM, please contact night-coverage at [email or web address], password [email or web address] 06/09/2014, 4:59 PM  LOS: 5 days

## 2014-06-09 NOTE — Progress Notes (Addendum)
Clinical Social Work  CSW continues to search for inpatient placement and contacted the following facilties:  [facility]- unsure of bed availability but CSW faxed referral.  [facility]- AC Inetta Fermo(Tina) reports she will call CSW if bed is available (Addendum 1425: [facility] who reports patient remains on waiting list but unsure of bed status at this time. AC will alert CSW if bed becomes available)   [facility]- no available beds.  [facility]- no available beds  [facility]- no available beds  [facility]- no available beds  [facility]- admissions reports they need to present case to MD but will call CSW if they can accept  [facility]- admissions reports they have not reviewed referral yet but will call CSW if they can accept patient but they are unsure of bed status  [facility]- no available beds  [facility]- [facility] beds  [facility]- no [facility] beds  [facility]- denied on 12/13  [facility]- patient placed on wait list on 12/12 and admissions report they will call if bed is available   [facility]- no [facility] beds  [facility]- no available beds  [facility]- no available beds  [facility]- no [place] beds available  [facility]- no available beds  [facility]- denied on 12/15  CSW will continue to follow.  Timbercreek CanyonHolly Antwaine [name], [place]CS[place] [phone]

## 2014-06-10 DIAGNOSIS — T50902S Poisoning by unspecified drugs, medicaments and biological substances, intentional self-harm, sequela: Secondary | ICD-10-CM

## 2014-06-10 MED ORDER — TRAZODONE HCL 150 MG PO TABS: 150.0000 mg | ORAL_TABLET | Freq: Every day | ORAL | Status: AC

## 2014-06-10 MED ORDER — FLUOXETINE HCL 20 MG PO CAPS: 60.0000 mg | ORAL_CAPSULE | Freq: Every day | ORAL | Status: AC

## 2014-06-10 NOTE — Care Management Note (Signed)
    Page 1 of 1   06/10/2014     3:53:08 PM CARE MANAGEMENT NOTE 06/10/2014  Patient:  Alexander [name],Alexander [name]   Account Number:  [id number]  Date Initiated:  06/05/2014  Documentation initiated by:  Trinna BalloonMcGIBBONEY,COOKIE MYRAETTE  Subjective/Objective Assessment:   Pt admitted with Overdose     Action/Plan:   from home   Anticipated DC Date:  06/10/2014   Anticipated DC Plan:  PSYCHIATRIC HOSPITAL  In-house referral  Clinical Social Worker      DC Planning Services  CM consult  Medication Assistance      Choice offered to / List presented to:             Status of service:  Completed, signed off Medicare Important Message given?   (If response is "NO", the following Medicare IM given date fields will be blank) Date Medicare IM given:   Medicare IM given by:   Date Additional Medicare IM given:   Additional Medicare IM given by:    Discharge Disposition:  PSYCHIATRIC HOSPITAL  Per UR Regulation:  Reviewed for med. necessity/level of care/duration of stay  If discussed at Long Length of Stay Meetings, dates discussed:   06/09/2014    Comments:  06/10/14 Lanier ClamKathy Ahilyn Nell RN,BSN NCM [phone] d/c inpt psych.  06/05/14 MMcGibboney, RN, BSN Pt admitted with cco Seroquel overdose as suicide attempt and polysubstance intoxication.

## 2014-06-10 NOTE — Progress Notes (Signed)
Clinical Social Work  CSW continues to search for inpatient placement and contacted the following facilties:  [facility]- admissions has not reviewed referral but will call CSW if they can accept  [facility]) reports she will call CSW if bed is available   [facility]- unsure of bed status but stated that CSW could send information. Referral faxed.  [facility]- available beds. Referral faxed.  [facility]- no available beds  [facility]- available beds. Referral faxed.  [facility]- admissions reports that intake coordinator is still reviewing and will call if they can accept.  [facility]- admissions reports they have referral but unsure if they will have any available beds.  [facility]- no available beds  [facility] available beds. Referral faxed.  [facility]- no available beds  [facility]- denied on 12/13  [facility]- patient placed on wait list on 12/12 and admissions report no available bed today  [facility]- no available beds  [facility]- no available beds  [facility]- no available beds  [facility]- no [facility] beds available  [facility]- no available beds  [facility]- denied on 12/15  CSW will continue to follow.  MazonHolly Hrishikesh Hoeg, [place] [phone]
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
## 2014-06-10 NOTE — Progress Notes (Signed)
UR completed 
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
## 2014-06-10 NOTE — Progress Notes (Signed)
Pt's belongings returned at discharge.  Clothing and shoes present.  Pt discharged as planned accompanied by sheriff.  Pt states he had other belongings upon admission, a backpack with his wallet (which includes his ID/license and social security card) and a heavy winter jacket.  Pt states he was dropped off by a staff member of "[facility]", via a service that takes people to their doctor appointments and job interviews.  Thinks his driver was named Programmer[employer]"Mike".  Pt states he had these items in the car and thinks they may have been left in the car.    Alexander[facility], [phone] and left a message for the [facility] division to call us back regarding his belongings.  Report called to [place] at [facility].  Advised her of the above regarding pt's belongings.  She will follow up with [facility].  Ardyth GalAnderson, Jeramyah Goodpasture Ann, RN 06/10/2014
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
## 2014-10-20 IMAGING — CT CT ANGIO CHEST
1 of 3 series · 18 of 32 positions shown · IV contrast (omnipaque)
Comparison: Prior radiograph from earlier the same day.

CLINICAL DATA: CHEST PAIN, LEFT ARM NUMBNESS. EVALUATE FOR
DISSECTION.

EXAM:
CT ANGIOGRAPHY CHEST, ABDOMEN AND PELVIS
TECHNIQUE: Multidetector CT imaging through the chest, abdomen and pelvis was
performed using the standard protocol during bolus administration of
intravenous contrast. Multiplanar reconstructed images and MIPs were
obtained and reviewed to evaluate the vascular anatomy.
CONTRAST:  80mL OMNIPAQUE IOHEXOL 350 MG/ML SOLN

[Series 6: arterial 3.0 b30f · axial · arterial · 0.76mm/px · z∈[+1035,+1584]mm · 18 of 205 slices shown]
[im 11/205  lung]
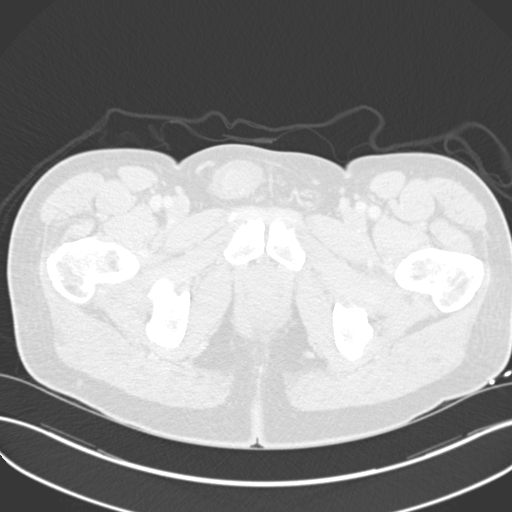
[im 21/205  soft-tissue]
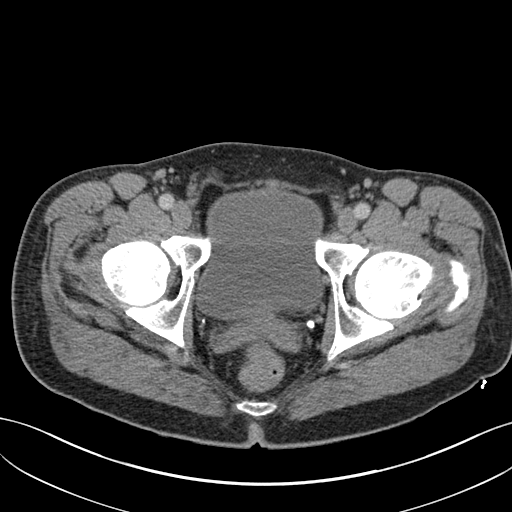
[im 31/205  lung]
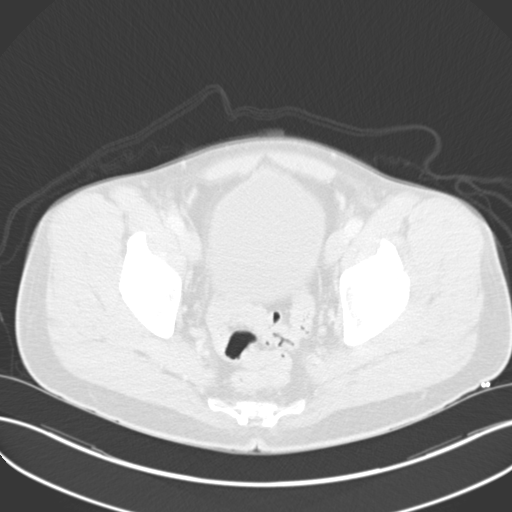
[im 41/205  soft-tissue]
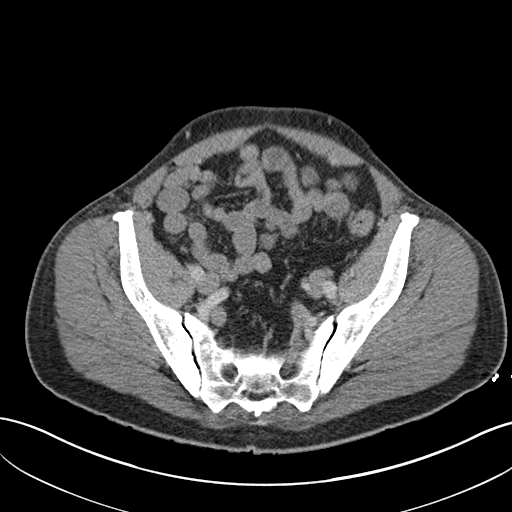
[im 52/205  lung]
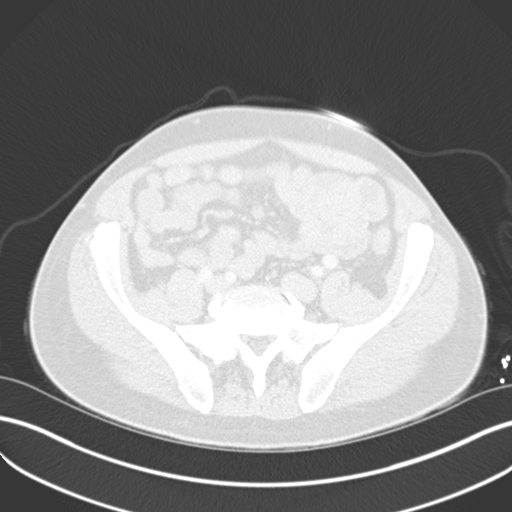
[im 62/205  soft-tissue]
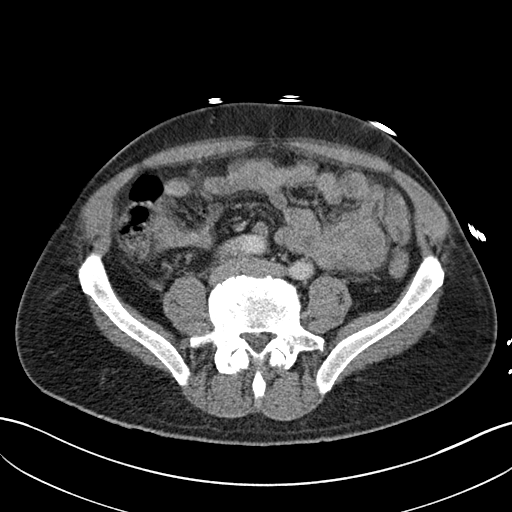
[im 72/205  lung]
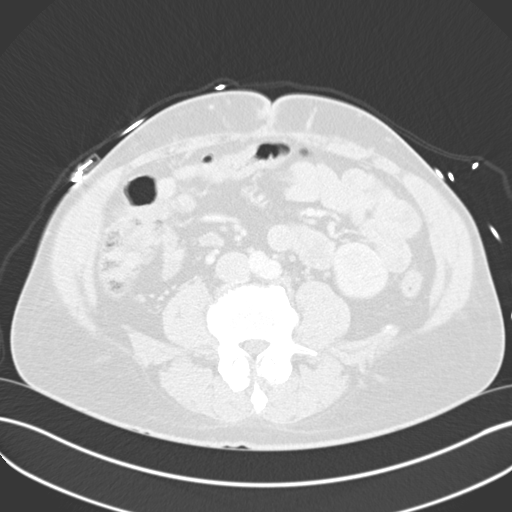
[im 82/205  soft-tissue]
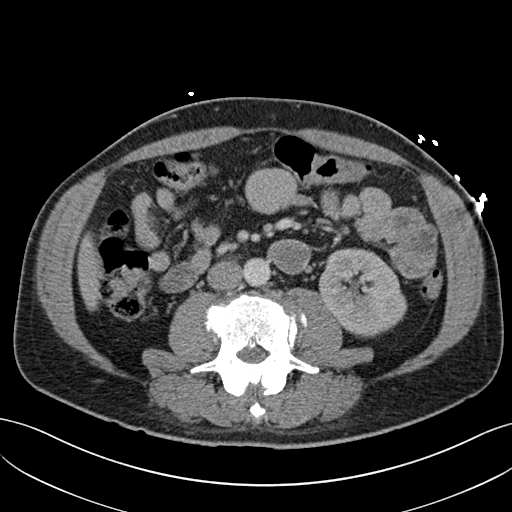
[im 92/205  lung]
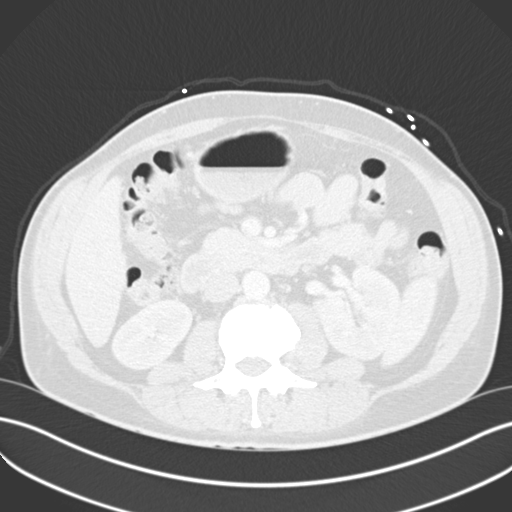
[im 113/205  soft-tissue]
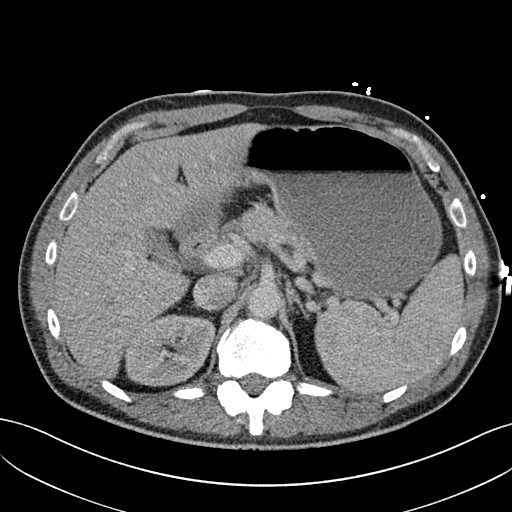
[im 123/205  lung]
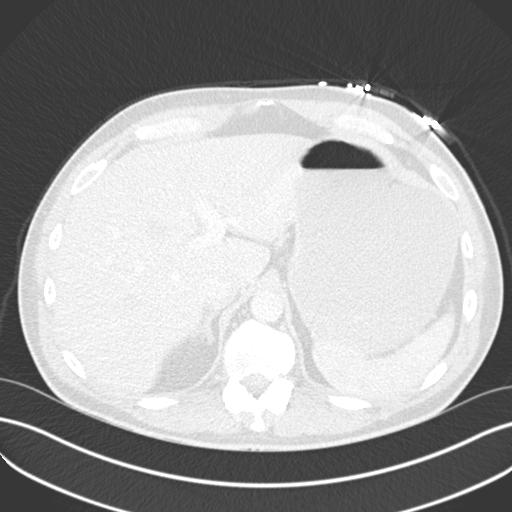
[im 133/205  soft-tissue]
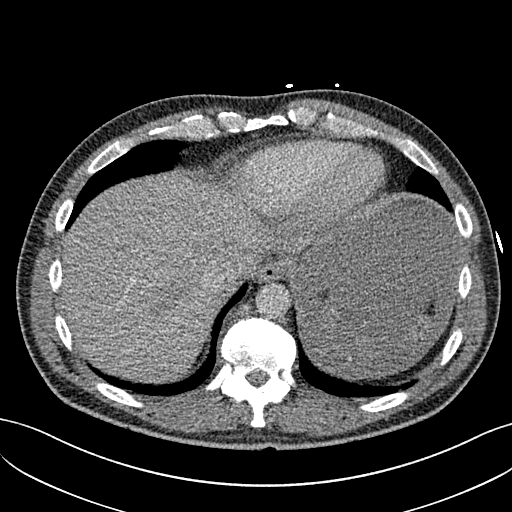
[im 143/205  lung]
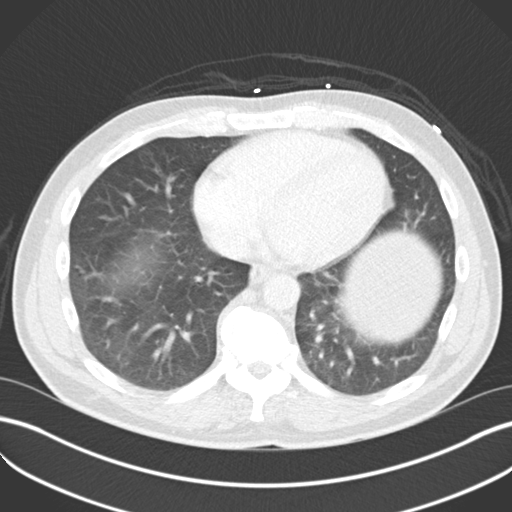
[im 154/205  soft-tissue]
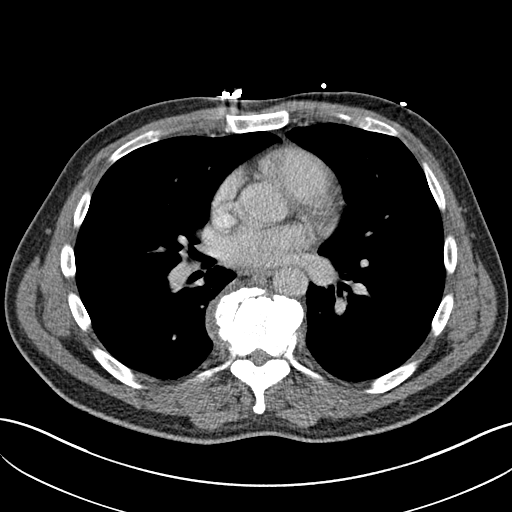
[im 164/205  lung]
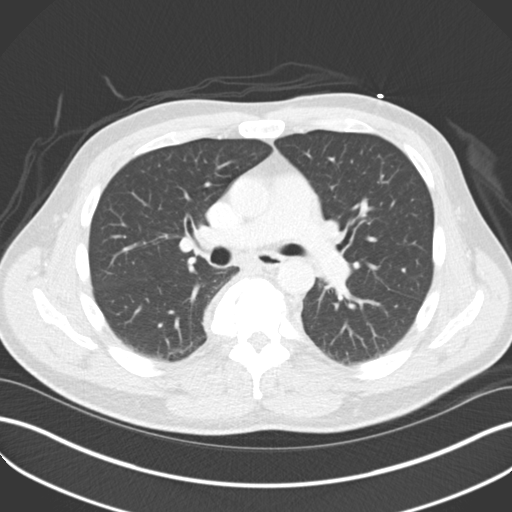
[im 174/205  soft-tissue]
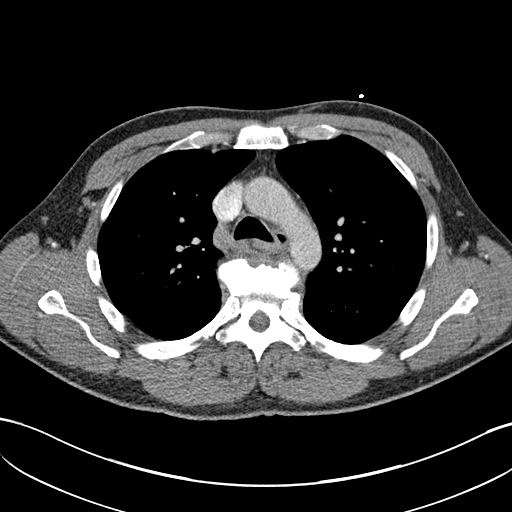
[im 184/205  lung]
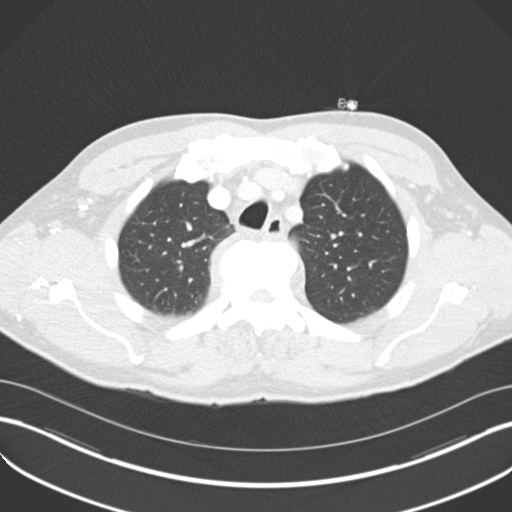
[im 194/205  soft-tissue]
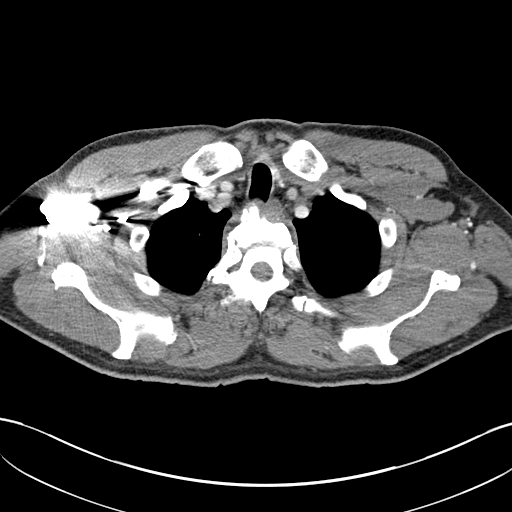

[18 of 32 positions shown; findings below may reference images not displayed]

FINDINGS: CTA CHEST FINDINGS

Thyroid gland is unremarkable. No mediastinal, hilar, or axillary
adenopathy is identified.

Evaluation of the intrathoracic aorta somewhat limited due to timing
of the contrast bolus. The intrathoracic aorta is of normal caliber
without evidence of dissection or other acute abnormality. No
aneurysm. Great vessels are normal.

Heart size is within normal limits. No pericardial effusion.
Pulmonary arteries are unremarkable.

Lungs are clear without focal infiltrate, pulmonary edema, or
pleural effusion. No worrisome pulmonary nodule or mass.

No acute osseous abnormality identified within the thorax.

Review of the MIP images confirms the above findings.

CTA ABDOMEN AND PELVIS FINDINGS

Scattered calcifications noted within the liver. The liver is
otherwise unremarkable. Gallbladder within normal limits. No biliary
dilatation. The spleen, adrenal glands, and pancreas demonstrate a
normal contrast enhanced appearance.

Kidneys are equal in size with symmetric enhancement. No
nephrolithiasis, hydronephrosis, or focal enhancing renal mass.

Stomach is mildly distended with fluid to present within the gastric
lumen. No evidence of obstruction. Appendix is normal. No
inflammatory changes seen about the bowels.

Bladder are unremarkable.  Prostate within normal limits.

Small right inguinal hernia with fluid present within the right
scrotal sac noted.

No free air or fluid.

Normal intravascular enhancement seen throughout the intra-abdominal
aorta are without evidence of dissection or other acute abnormality.
No intra-abdominal aneurysm. Mild atheromatous plaque present within
the proximal common iliac arteries bilaterally, left greater than
right.

No acute osseous abnormality within the abdomen and pelvis.
Prominent bulky osteophytic spurring seen within the thoracic and
lumbar spine. Degenerative changes noted about the hips bilaterally,
left greater than right.

Review of the MIP images confirms the above findings.
IMPRESSION: 1. No CTA evidence of aortic dissection or other acute aortic
abnormality. No aneurysm.
2. No other acute abnormality within the chest, abdomen, or pelvis.

## 2014-10-20 IMAGING — CR DG CHEST 1V PORT
1 series · 1 of 1 positions shown · non-contrast
Comparison: None.

CLINICAL DATA: Sudden onset of chest pain.

EXAM:
PORTABLE CHEST - 1 VIEW

[AP]
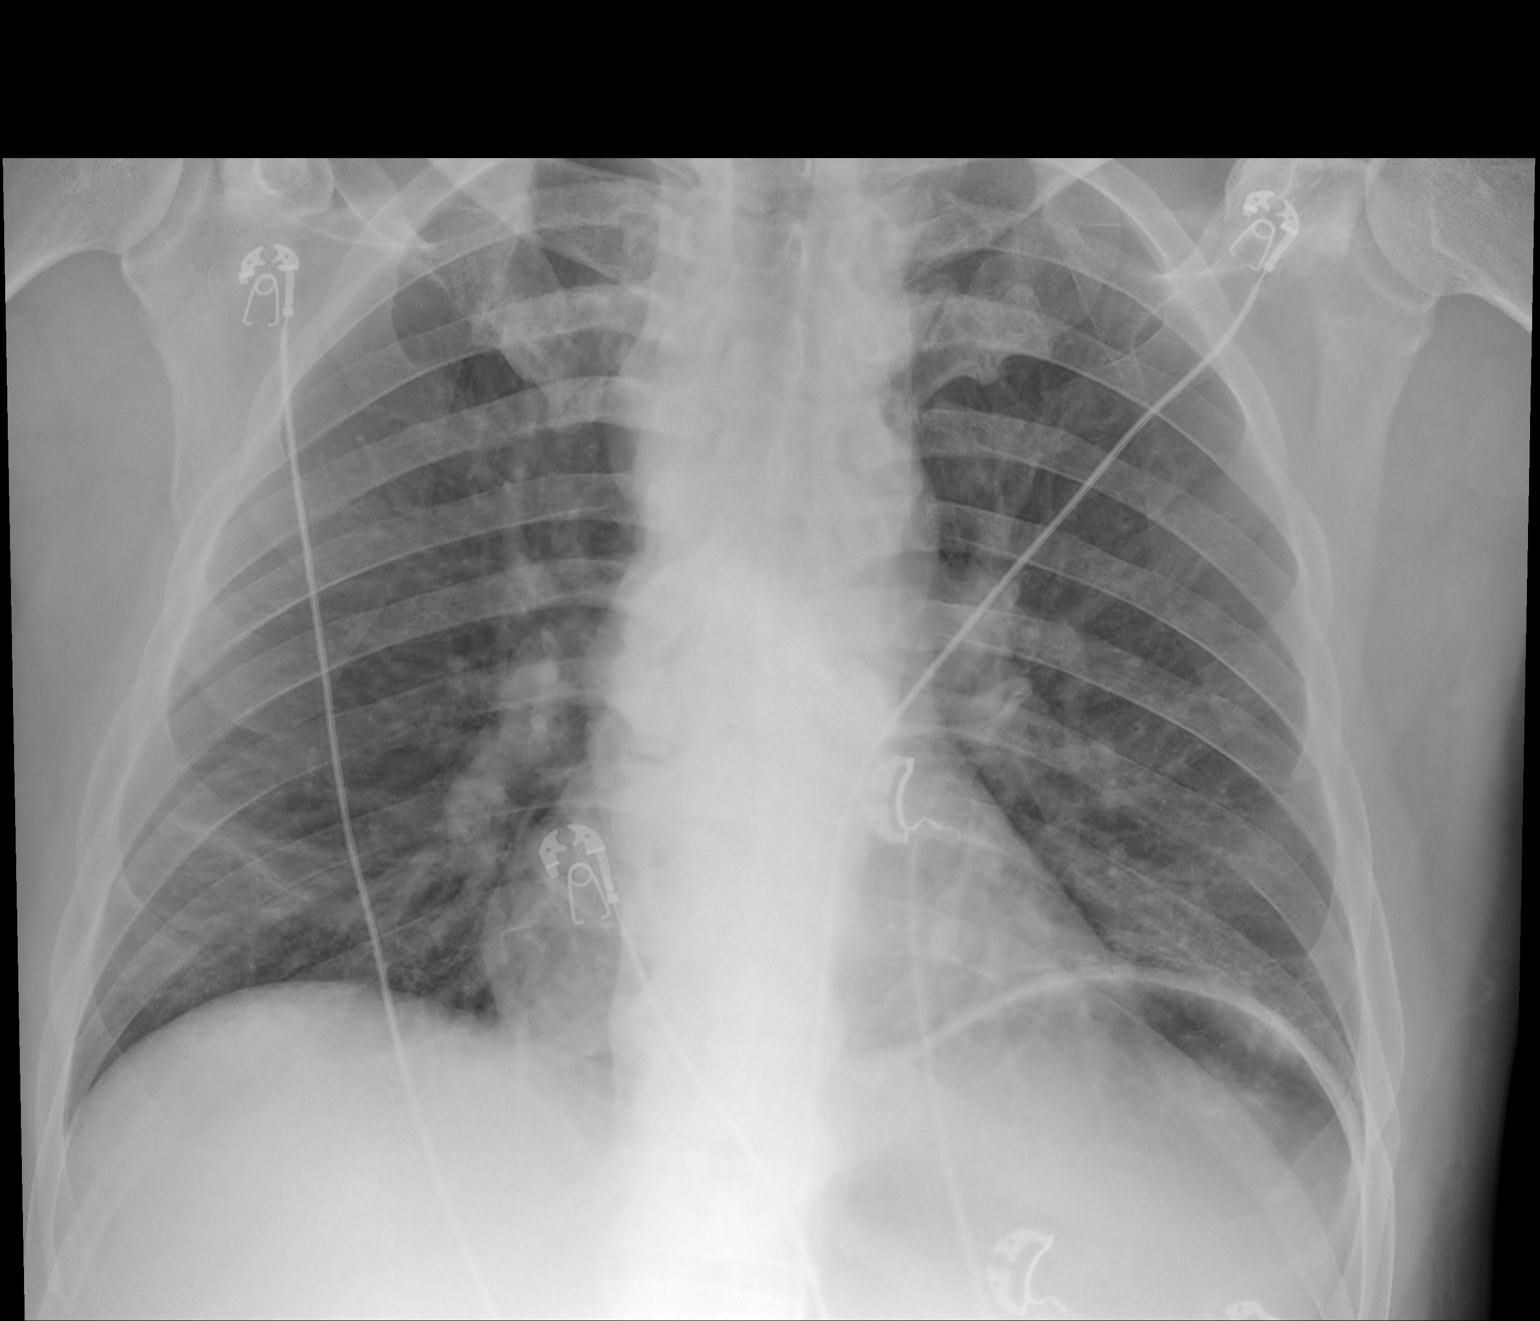

[1 of 1 positions shown; findings below may reference images not displayed]

FINDINGS: The lungs are well-aerated. Mild vascular congestion is noted.
Minimal bibasilar atelectasis is seen. There is no evidence of
pleural effusion or pneumothorax.

The cardiomediastinal silhouette is within normal limits. No acute
osseous abnormalities are seen.
IMPRESSION: Mild vascular congestion noted.  Minimal bibasilar atelectasis seen.

## 2015-01-04 IMAGING — CT CT RENAL STONE PROTOCOL
2 of 4 series · 17 of 46 positions shown, 19 images · non-contrast
Comparison: 03/11/2014

CLINICAL DATA: Left flank pain for 2 days.

EXAM:
CT ABDOMEN AND PELVIS WITHOUT CONTRAST
TECHNIQUE: Multidetector CT imaging of the abdomen and pelvis was performed
following the standard protocol without IV contrast.

[Series 4: stone study 5.0 i30f 1 · axial · 0.79mm/px · z∈[-894,-468]mm · 14 of 93 slices shown, 16 images]
[im 4/93  soft-tissue]
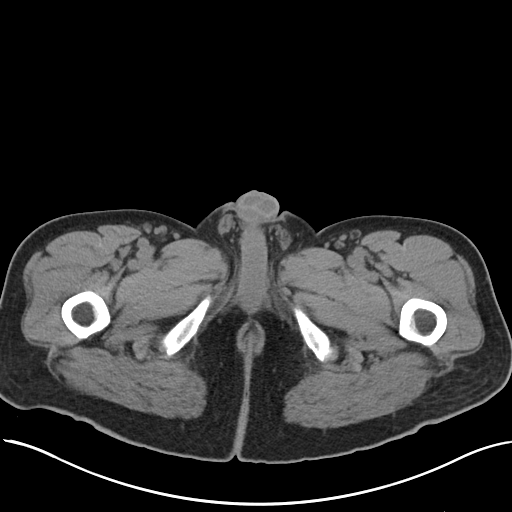
[im 4/93  bone]
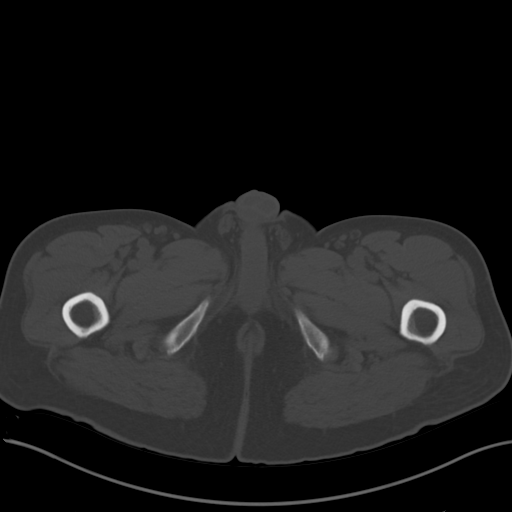
[im 12/93  soft-tissue]
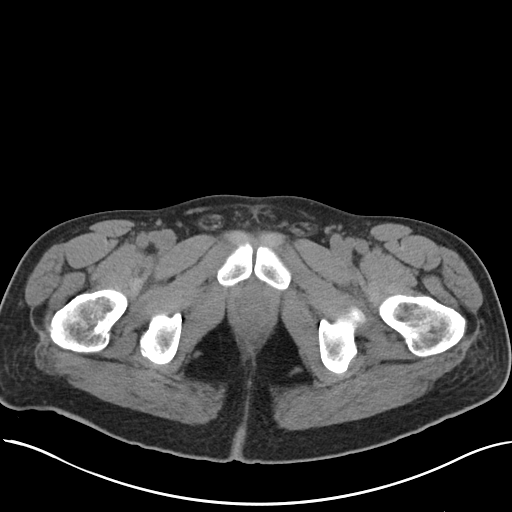
[im 20/93  soft-tissue]
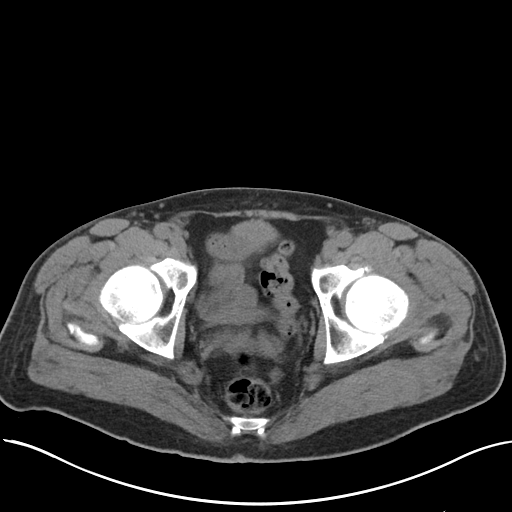
[im 24/93  soft-tissue]
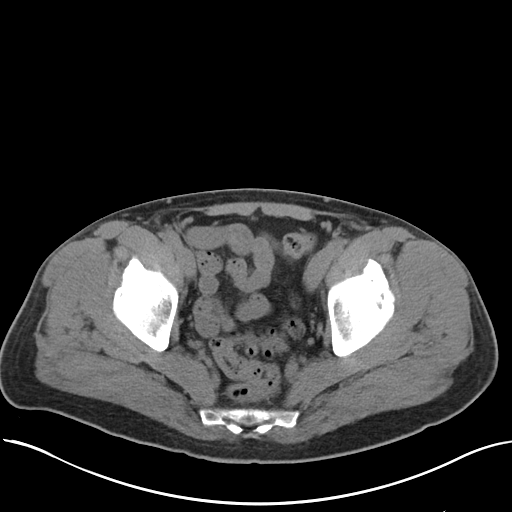
[im 31/93  soft-tissue]
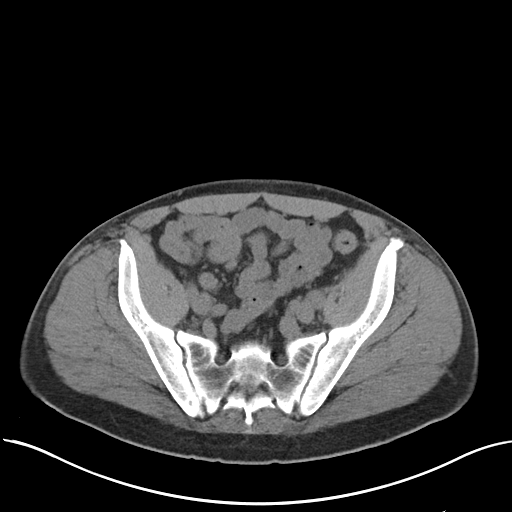
[im 39/93  soft-tissue]
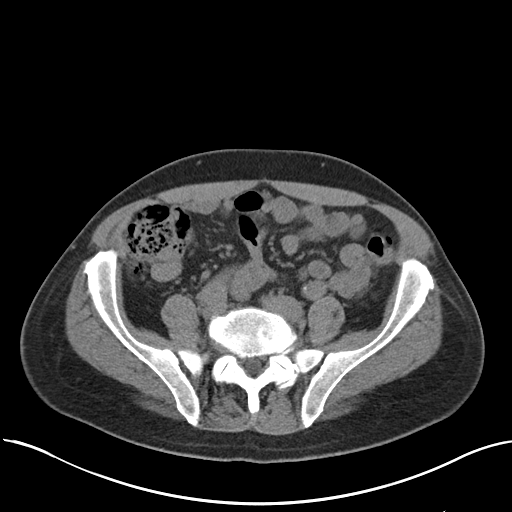
[im 43/93  soft-tissue]
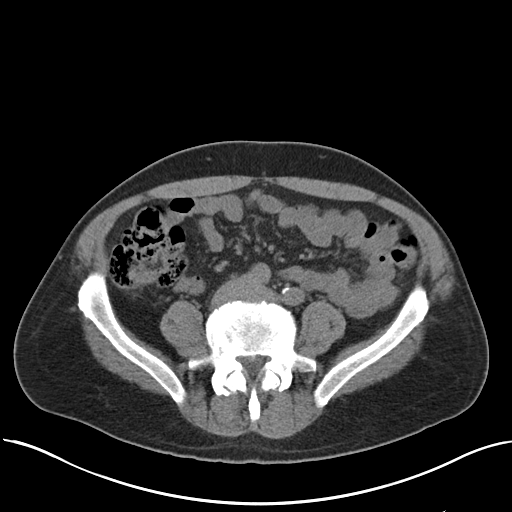
[im 50/93  soft-tissue]
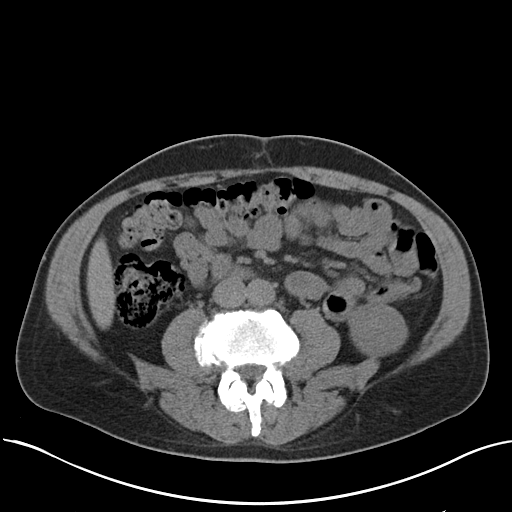
[im 54/93  soft-tissue]
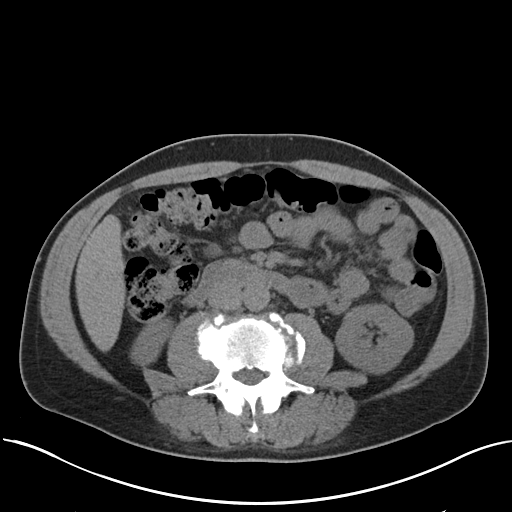
[im 54/93  bone]
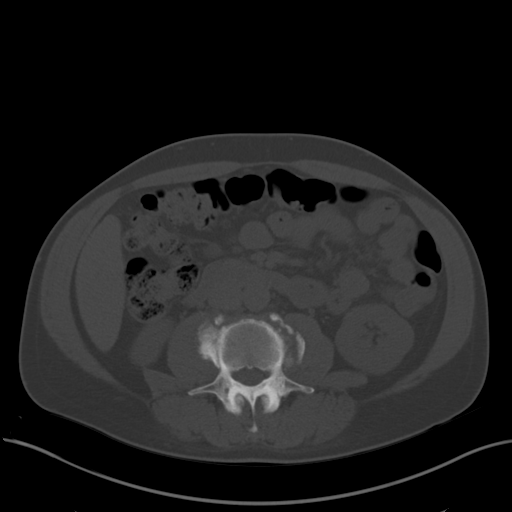
[im 62/93  soft-tissue]
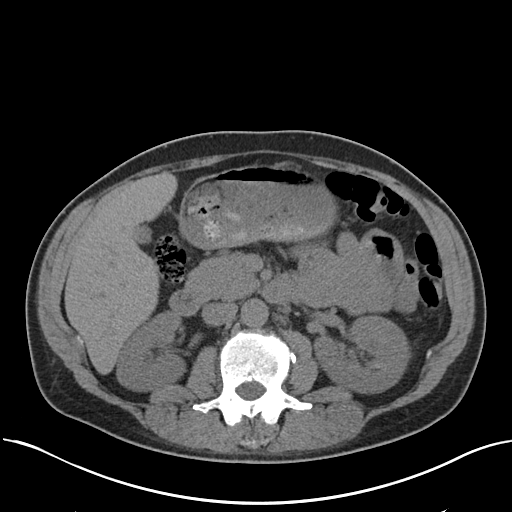
[im 70/93  soft-tissue]
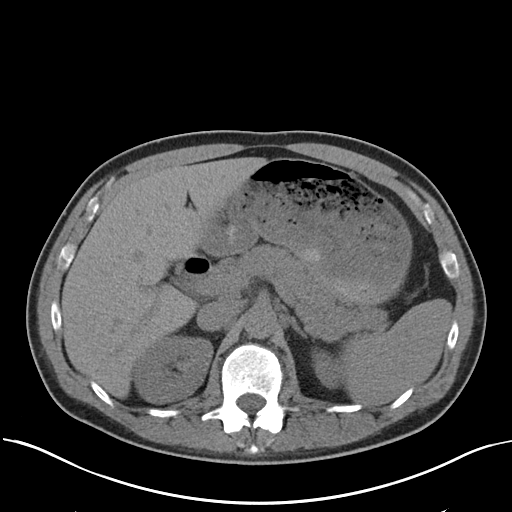
[im 73/93  soft-tissue]
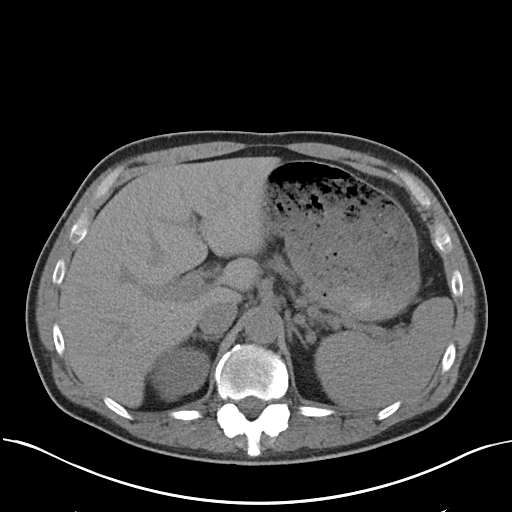
[im 81/93  soft-tissue]
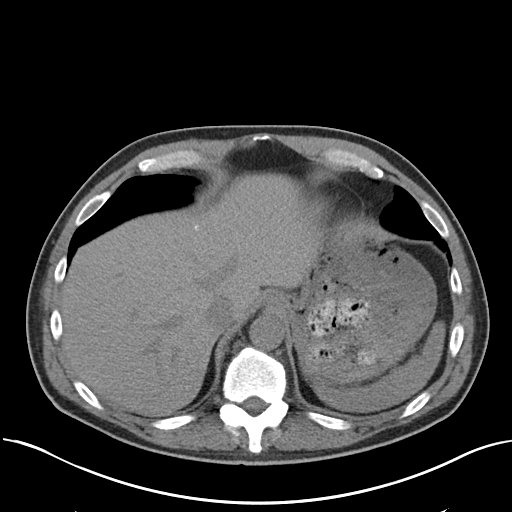
[im 89/93  soft-tissue]
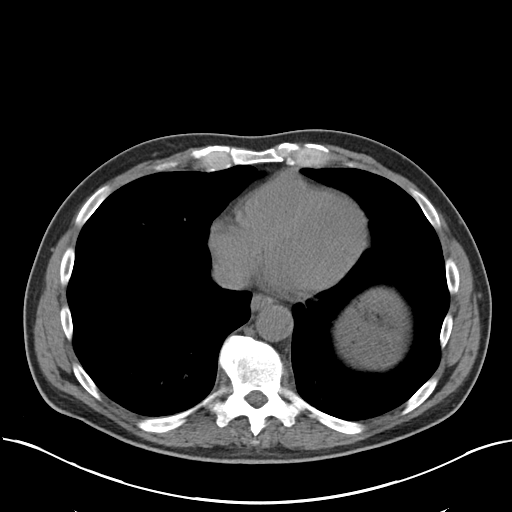

[Series 7: coronal soft tissue · coronal · 0.78mm/px · 3 of 78 slices shown]
[im 26/78  soft-tissue]
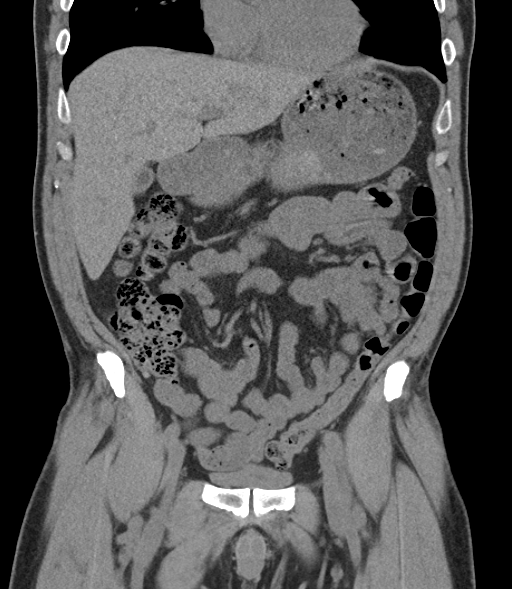
[im 35/78  soft-tissue]
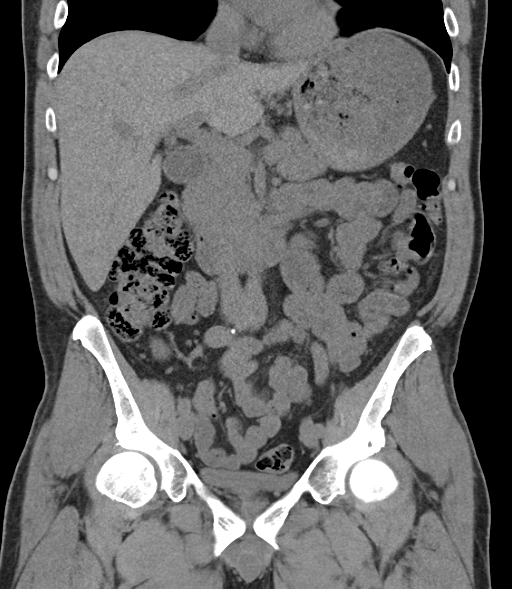
[im 43/78  soft-tissue]
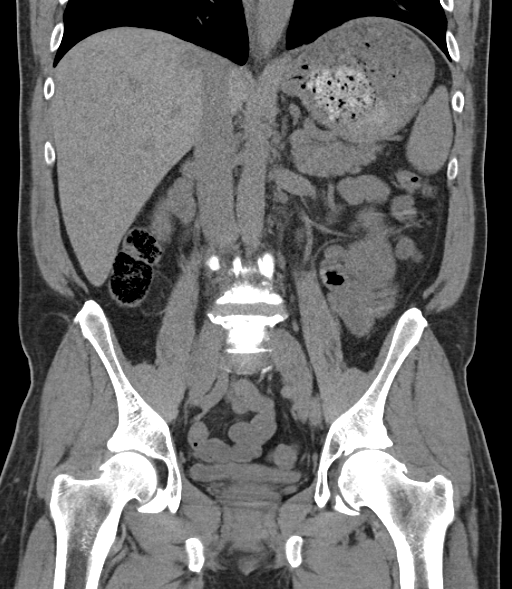

[17 of 46 positions shown; findings below may reference images not displayed]

FINDINGS: There are calcified granulomas in the liver and spleen, unchanged.
Liver parenchyma is otherwise normal. Pancreas, adrenal glands, and
kidneys are normal. There are no renal or ureteral calculi or
bladder calculi. Bladder is normal. Prostate gland is normal. The
bowel is normal including the terminal ileum and appendix. No free
air or free fluid.

There are moderate arthritic changes of both hips, left worse than
right. Flowing osteophytes fuse much of the thoracic and upper
lumbar spine. There is diffuse facet arthritis throughout the lumbar
spine.
IMPRESSION: No acute abnormality of the abdomen or pelvis. No renal or ureteral
calculi. Diffuse degenerative changes in the spine.
